# Patient Record
Sex: Male | Born: 1950 | Race: White | Hispanic: No | Marital: Married | Smoking: Former smoker
Health system: Southern US, Community
[De-identification: ages and names within clinical notes are randomized; demographics above are authoritative.]

## PROBLEM LIST (undated history)

## (undated) DIAGNOSIS — F32A Depression, unspecified: Secondary | ICD-10-CM

## (undated) DIAGNOSIS — Z972 Presence of dental prosthetic device (complete) (partial): Secondary | ICD-10-CM

## (undated) DIAGNOSIS — F419 Anxiety disorder, unspecified: Secondary | ICD-10-CM

## (undated) DIAGNOSIS — M1990 Unspecified osteoarthritis, unspecified site: Secondary | ICD-10-CM

## (undated) DIAGNOSIS — I219 Acute myocardial infarction, unspecified: Secondary | ICD-10-CM

## (undated) DIAGNOSIS — G709 Myoneural disorder, unspecified: Secondary | ICD-10-CM

## (undated) DIAGNOSIS — C801 Malignant (primary) neoplasm, unspecified: Secondary | ICD-10-CM

## (undated) DIAGNOSIS — Z973 Presence of spectacles and contact lenses: Secondary | ICD-10-CM

## (undated) DIAGNOSIS — K219 Gastro-esophageal reflux disease without esophagitis: Secondary | ICD-10-CM

## (undated) DIAGNOSIS — I2510 Atherosclerotic heart disease of native coronary artery without angina pectoris: Secondary | ICD-10-CM

## (undated) DIAGNOSIS — G4730 Sleep apnea, unspecified: Secondary | ICD-10-CM

## (undated) DIAGNOSIS — I10 Essential (primary) hypertension: Secondary | ICD-10-CM

## (undated) DIAGNOSIS — F329 Major depressive disorder, single episode, unspecified: Secondary | ICD-10-CM

## (undated) DIAGNOSIS — E119 Type 2 diabetes mellitus without complications: Secondary | ICD-10-CM

## (undated) DIAGNOSIS — N200 Calculus of kidney: Secondary | ICD-10-CM

## (undated) HISTORY — PX: TONSILLECTOMY: SUR1361

## (undated) HISTORY — PX: COLONOSCOPY W/ BIOPSIES: SHX1374

## (undated) HISTORY — PX: MASTOIDECTOMY: SUR855

## (undated) HISTORY — PX: GREAT TOE ARTHRODESIS, INTERPHALANGEAL JOINT: SUR55

## (undated) HISTORY — PX: CHOLECYSTECTOMY: SHX55

## (undated) HISTORY — PX: OTHER SURGICAL HISTORY: SHX169

---

## 2005-03-16 ENCOUNTER — Ambulatory Visit (HOSPITAL_COMMUNITY): Admission: RE | Admit: 2005-03-16 | Discharge: 2005-03-16 | Payer: Self-pay | Admitting: Ophthalmology

## 2005-07-13 ENCOUNTER — Encounter (INDEPENDENT_AMBULATORY_CARE_PROVIDER_SITE_OTHER): Payer: Self-pay | Admitting: Specialist

## 2005-07-13 ENCOUNTER — Ambulatory Visit (HOSPITAL_COMMUNITY): Admission: RE | Admit: 2005-07-13 | Discharge: 2005-07-13 | Payer: Self-pay | Admitting: Ophthalmology

## 2005-09-18 HISTORY — PX: EYE SURGERY: SHX253

## 2007-03-24 ENCOUNTER — Ambulatory Visit: Payer: Self-pay | Admitting: Internal Medicine

## 2007-03-25 ENCOUNTER — Encounter: Payer: Self-pay | Admitting: Internal Medicine

## 2007-03-25 ENCOUNTER — Ambulatory Visit: Payer: Self-pay | Admitting: Internal Medicine

## 2007-03-25 ENCOUNTER — Ambulatory Visit (HOSPITAL_COMMUNITY): Admission: RE | Admit: 2007-03-25 | Discharge: 2007-03-25 | Payer: Self-pay | Admitting: Internal Medicine

## 2007-04-20 ENCOUNTER — Ambulatory Visit: Payer: Self-pay | Admitting: Internal Medicine

## 2009-04-20 HISTORY — PX: CARDIAC CATHETERIZATION: SHX172

## 2010-06-19 HISTORY — PX: EYE SURGERY: SHX253

## 2010-07-17 ENCOUNTER — Ambulatory Visit (HOSPITAL_COMMUNITY): Payer: BC Managed Care – PPO

## 2010-07-17 ENCOUNTER — Observation Stay (HOSPITAL_COMMUNITY)
Admission: RE | Admit: 2010-07-17 | Discharge: 2010-07-18 | Disposition: A | Discharge status: Home / Self Care | Payer: BC Managed Care – PPO | Source: Ambulatory Visit | Attending: Ophthalmology | Admitting: Ophthalmology

## 2010-07-17 DIAGNOSIS — Z01812 Encounter for preprocedural laboratory examination: Secondary | ICD-10-CM | POA: Insufficient documentation

## 2010-07-17 DIAGNOSIS — H35379 Puckering of macula, unspecified eye: Principal | ICD-10-CM | POA: Insufficient documentation

## 2010-07-17 DIAGNOSIS — E119 Type 2 diabetes mellitus without complications: Secondary | ICD-10-CM | POA: Insufficient documentation

## 2010-07-17 DIAGNOSIS — Z9861 Coronary angioplasty status: Secondary | ICD-10-CM | POA: Insufficient documentation

## 2010-07-17 DIAGNOSIS — Z01818 Encounter for other preprocedural examination: Secondary | ICD-10-CM | POA: Insufficient documentation

## 2010-07-17 DIAGNOSIS — I2510 Atherosclerotic heart disease of native coronary artery without angina pectoris: Secondary | ICD-10-CM | POA: Insufficient documentation

## 2010-07-17 LAB — BASIC METABOLIC PANEL
CO2: 29 mEq/L (ref 19–32)
Calcium: 9.7 mg/dL (ref 8.4–10.5)
Chloride: 103 mEq/L (ref 96–112)
Creatinine, Ser: 1.02 mg/dL (ref 0.4–1.5)
GFR calc Af Amer: >60 mL/min (ref >60)
GFR calc non Af Amer: >60 mL/min (ref >60)
Potassium: 4.8 mEq/L (ref 3.5–5.1)
Sodium: 139 mEq/L (ref 135–145)

## 2010-07-17 LAB — CBC
HCT: 42.7 % (ref 39.0–52.0)
Hemoglobin: 13.8 g/dL (ref 13.0–17.0)
MCH: 28.4 pg (ref 26.0–34.0)
MCHC: 32.3 g/dL (ref 30.0–36.0)
MCV: 87.9 fL (ref 78.0–100.0)
Platelets: 289 10*3/uL (ref 150–400)
RBC: 4.86 MIL/uL (ref 4.22–5.81)
RDW: 12.8 % (ref 11.5–15.5)
WBC: 8.2 10*3/uL (ref 4.0–10.5)

## 2010-07-17 LAB — SURGICAL PCR SCREEN
MRSA, PCR: POSITIVE — AB
Staphylococcus aureus: POSITIVE — AB

## 2010-07-17 LAB — GLUCOSE, CAPILLARY
Glucose-Capillary: 134 mg/dL — ABNORMAL HIGH (ref 70–99)
Glucose-Capillary: 154 mg/dL — ABNORMAL HIGH (ref 70–99)

## 2010-07-18 LAB — GLUCOSE, CAPILLARY
Glucose-Capillary: 138 mg/dL — ABNORMAL HIGH (ref 70–99)
Glucose-Capillary: 166 mg/dL — ABNORMAL HIGH (ref 70–99)
Glucose-Capillary: 331 mg/dL — ABNORMAL HIGH (ref 70–99)
Glucose-Capillary: 199 mg/dL — ABNORMAL HIGH (ref 70–99)

## 2010-07-18 NOTE — Op Note (Signed)
  [name], [name]               ACCOUNT NO.:  [id number]  MEDICAL RECORD NO.:  [id number]           PATIENT TYPE:  I  LOCATION:  5125                         FACILITY:  [facility]  PHYSICIAN:  John D. Ashley Royalty, M.D. DATE OF BIRTH:  April 26, 1950  DATE OF PROCEDURE:  07/17/2010 DATE OF DISCHARGE:                              OPERATIVE REPORT   ADMISSION DIAGNOSES:  Preretinal fibrosis, right eye and retinal breaks, right eye.  PROCEDURES:  Pars plana vitrectomy right eye with membrane peel, retinal photocoagulation all in the right eye.  SURGEON:  Beulah Gandy. Ashley Royalty, MD  ASSISTANT:  Rosalie Doctor, MA  ANESTHESIA:  General.  DETAILS:  Usual prep and drape, the indirect retinal ophthalmoscope laser was moved into place, 570 burns were placed around retinal breaks in the lower temporal quadrant and two rows of laser for 360 degrees. The power was 180 milliwatts, 1000 microns each and 0.1 seconds each.  A 25-gauge trocars were placed at 8 and 10 o'clock.  A three layered MVR incision 20-gauge was made at 2 o'clock.  The pars plana vitrectomy was begun just behind the pseudophakos.  Dense vitreous membranes were encountered and they were carefully removed under low suction and rapid cutting.  The macular region came into view and there were white striae in the macula and the membranes were thrown into folds.  The MVR blade was used to engage the internal limiting membrane and the preretinal fibrosis.  These were peeled with the lighted pick with a diamond dusted membrane scraper and forceps until the entire macular region was peeled and free of the preretinal fibrosis membrane.  The vitrectomy was then widened to the mid periphery, where all vitreous was removed and the far periphery where the vitreous base was trimmed for 360 degrees.  Kenalog 0.05 mL was injected into the vitreous cavity.  The trocars were removed from the eye and the wounds were held until tight 25 via  20-gauge opening at 2 o'clock was closed with one 9-0 nylon suture.  The conjunctivae was closed with wet-field cautery.  Polymyxin and gentamicin were irrigated into Tenon space.  Atropine solution was applied.  Marcaine was injected around the globe for postop pain. Closing pressure was 15 with a Barraquer keratometer.  COMPLICATIONS:  None.  OPERATIVE TIME:  1 hour.    Beulah Gandy. Ashley Royalty, M.D.    JDM/MEDQ  D:  07/17/2010  T:  07/18/2010  Job:  [id number]  Electronically Signed by Alan Mulder M.D. on 07/18/2010 06:14:54 AM

## 2010-09-02 NOTE — Assessment & Plan Note (Signed)
NAMEPRAJWAL, FELLNER                CHART#:  [id number]   DATE:  04/20/2007                       DOB:  1950/12/11   CHIEF COMPLAINT:  Followup for diarrhea.   SUBJECTIVE:  Mr. [name] is here for followup.  He recently underwent  ileocolonoscopy with biopsy and stool sampling on March 25, 2007.  He  had presented to our office with a one-month history of incessant,  nonbloody diarrhea of acute onset.  This had occurred after he came off  a two-month course of antibiotics for epididymitis/orchitis.  Endoscopically, he had proximal rectal erosions, extending up into the  mid-sigmoid colon.  The TI was normal.  Stool sampling was negative.  He  had a benign rectal polyp.  Biopsies revealed benign colonic mucosa.  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  Given that he had already been on Flagyl and was quite ill, he  was started on vancomycin and he completed a ten-day course recently.  He states he is much better.  He is having only four stools a day.  His  baseline is usually two to three.  Stools are not entirely formed, but  are semi-formed.  He has no abdominal pain.  He feels well.  He has been  off the antibiotics for at least a week and continues to gradually  improve.  Denies any blood in the stool, abdominal pain, nausea or  vomiting.   CURRENT MEDICATIONS:  See updated list.   ALLERGIES:  No known drug allergies.   PHYSICAL EXAM:  Weight 223.5, stable; temperature 98.1, blood pressure  108/78, pulse 88.  GENERAL:  Pleasant, well-nourished, well-developed, Caucasian male, in  no acute distress.  SKIN:  Warm and dry, no jaundice.  HEENT:  Sclerae anicteric.  ABDOMEN:  Positive bowel sounds, soft, nontender, nondistended.  No  organomegaly or masses.  No rebound tenderness or guarding.  No  abdominal bruits or hernias.  LOWER EXTREMITIES:  No edema.   IMPRESSION:  Mr. [name] is a 60 year old gentleman, suspected to have C.  difficile-associated colitis, which failed to respond to Flagyl.  His  stool studies were negative.  Endoscopically, he did have some distal  erosions from the rectum up to the sigmoid colon.  He has responded  nicely to vancomycin.  He is not quite at baseline, but has markedly  improved.  I discussed the case with Dr. Jena Gauss.  At this time, we will  continue to monitor him off antibiotics.  Patient was informed how to  cleanse his bathroom and make appropriate changes to hopefully avoid re-  infection.   PLAN:  He will go ahead and finish out his Questran.  He may use his  Bentyl as needed for loose stools or diarrhea.  He will continue  Probiotic for at least 30 more days.  He will call if he  develops any more frequent stools or his stools are more loose than they  have been.  Otherwise, we will follow up on a p.r.n. basis only.       Tana Coast, P.A.  Electronically Signed     R. Roetta Sessions, M.D.  Electronically Signed    LL/MEDQ  D:  04/20/2007  T:  04/20/2007  Job:  [id number]   cc:   Dr. Clelia Croft

## 2010-09-02 NOTE — Consult Note (Signed)
NAMEBRYANT, [name]               ACCOUNT NO.:  [id number]   MEDICAL RECORD NO.:  [id number]          PATIENT TYPE:  AMB   LOCATION:  DAY                           FACILITY:  [facility]   PHYSICIAN:  [name], M.D. DATE OF BIRTH:  1950-07-19   DATE OF CONSULTATION:  03/24/2007  DATE OF DISCHARGE:                                 CONSULTATION   REASON FOR CONSULTATION:  Diarrhea for three weeks.   REQUESTING PHYSICIAN:  Dalton Peri, MD, [facility].   HISTORY OF PRESENT ILLNESS:  Patient is a 60 year old Caucasian  gentleman who presents with about a four-week history of diarrhea.  He  states he developed diarrhea around the second week of November.  He had  it four or five days before presenting to see [name] on March 04, 2007.  Symptoms began acutely.  He is having more than 10 stools a day.  He has had no solid stools since that time.  He has had some nocturnal  diarrhea as well.  Denies any blood in the stools.  His stools have been  black.  All stools are very watery.  He complains of abdominal cramping  which is worse when he eats and occurs before his bowel movements.  He  complains of diffuse abdominal soreness as well.  Subsequently, he feels  he has had some fever but this has not been documented.  He has had  nausea but no vomiting.  He has had a flare of reflux.  Denies any  dysphagia or odynophagia.  He has lost about 20 pounds since this  started.  He has been trying a [employer]  He states this has helped  with his nocturnal diarrhea but he continues to have diarrhea throughout  the day.  He took a seven-day course of Flagyl initially.  He did have a  C. difficile which was negative.  Flagyl did not seem to help.  He has  subsequently been on Bentyl and Questran with really no relief.  He  denies any antibiotics before this starts except for about four months  ago.  He was on this for some type of urological problem.  He also  complains of low back  pain for about one week.  Denies any injuries.  Denies any dysuria or hematuria.   MEDICATIONS:  1. Questran 4 g daily although this stopped this the last couple of      days due to abdominal cramps.  2. Bentyl 20 mg q.a.c. and nightly.  3. Doxepin 25 mg b.i.d.  4. Prevacid SoluTab daily.   ALLERGIES:  No known drug allergies.   PAST MEDICAL HISTORY:  1. Diet controlled diabetes mellitus.  2. Gastroesophageal reflux disease.  3. Nephrolithiasis.   PAST SURGICAL HISTORY:  1. He reports a colonoscopy eight years ago by Dr. Rometta Bryant in      [place], [place], which was negative.  2. He has had a tonsillectomy.  3. He had a remote cholecystectomy.  4. Mastoid surgery.  5. Cataract extractions.  6. Surgery due to bleeding in his eye.  7.  He had a cyst removed from his hand.  8. Right  rotator cuff repair.  9. Vasectomy.  10.Kidney stone extraction.   FAMILY HISTORY:  His father has had multiple colonoscopies with removal  of polyps several times.  No family history of IBD or colorectal cancer.   SOCIAL HISTORY:  He is married.  He has two children.  He is employed at  [employer].  He smokes one to one and a half pack of  cigarettes daily.  Denies any alcohol use.   REVIEW OF SYSTEMS:  See HPI for GI and CONSTITUTIONAL.  CARDIOPULMONARY:  Denies chest pain, shortness of breath, dyspnea on exertion.  See HPI  for GU.   PHYSICAL EXAMINATION:  VITAL SIGNS:  Weight 224.5, height 5 feet 8,  temperature 98.8, blood pressure 110/80, pulse 80.  GENERAL APPEARANCE:  Pleasant, well-nourished, well-developed Caucasian  male in no acute distress.  He does not appear acutely ill.  SKIN:  Warm and dry, no jaundice.  HEENT:  Sclerae are nonicteric.  Oropharyngeal mucosa moist and pink.  No lesions, erythema or exudate.  NECK:  No lymphadenopathy or thyromegaly.  CHEST:  Lungs are clear to auscultation.  CARDIOVASCULAR:  Regular rate and rhythm. Normal S1 and S2.  No  murmurs,  rubs, or gallops.  ABDOMEN:  Positive bowel sounds.  Abdomen soft.  He has diffuse  tenderness throughout the abdomen especially coursing along the left  abdomen, across the transverse colon area and back down the right  abdomen.  Really no periumbilical tenderness, no hepatosplenomegaly or  masses, no abdominal bruits or hernias.  EXTREMITIES:  No edema.   IMPRESSION:  [name] is a 60 year old gentleman with a three to four-  week history of acute diarrhea.  He describes having quite a significant  amount of diarrhea including nocturnal diarrhea.  No blood in the  stools.  He has had a massive weight loss of 20 pounds in the last three  to four weeks.  Etiology of diarrhea unclear at this time.  He has had  one negative Clostridium difficile.  Otherwise, really no work-up has  been done.  This could be anything from infectious to inflammatory bowel  disease, microscopic colitis or even metabolic abnormality such as  hypothyroidism.   PLAN:  1. CMET, CBC, TSH stat.  2. Colonoscopy tomorrow.  3. Patient was advised to discontinue Questran until after his      procedure.   I have discussed the above case with Dr. Jena Bryant, who is in agreement with  the plan.  I would like to thank [name] for allowing [place] to take part  in the care of this patient.      [name], P.AJonathon Bryant, M.D.  Electronically Signed    LL/MEDQ  D:  03/24/2007  T:  03/24/2007  Job:  [id number]

## 2010-09-02 NOTE — Op Note (Signed)
NAMESTEFANO, [name]               ACCOUNT NO.:  [id number]   MEDICAL RECORD NO.:  [id number]          PATIENT TYPE:  AMB   LOCATION:  DAY                           FACILITY:  [facility]   PHYSICIAN:  R. Roetta Sessions, M.D. DATE OF BIRTH:  04/15/1951   DATE OF PROCEDURE:  03/25/2007  DATE OF DISCHARGE:                                PROCEDURE NOTE   PROCEDURE:  Ileocolonoscopy with biopsy and stool sampling.   ENDOSCOPIST:  Jonathon Bellows, M.D.   INDICATIONS FOR PROCEDURE:  This is a 60 year old gentleman with a 1-  month history of incessant non-bloody diarrhea of acute onset.  The  patient had just come off a 30-month course of antibiotics for epididymo-  orchitis.  He had normal bowel function describes as 1-2 formed bowel  movements daily prior to the acute onset of diarrhea 1 month ago.  A  Clostridium difficile toxin assay came back negative.  He failed to  respond to a 7-day course of Flagyl.  There is no history of  inflammatory bowel disease.  No one else around him has diarrhea.  He is  not using nonsteroidal agents.  Ileocolonoscopy is now being done to  further evaluate his symptoms.  This approach has discussed with the  patient at length and potential risks, benefits and alternatives have  been reviewed and questions answered; she is agreeable.  Please see  documentation in the medical record.   PROCEDURE NOTE:  O2 saturation, blood pressure, pulse and respirations  were monitored throughout the entire procedure.   CONSCIOUS SEDATION:  Demerol 75 mg IV, Versed 4 mg IV in divided doses.   INSTRUMENT:  Pentax video chip system.   FINDINGS:  Digital rectal exam revealed no abnormalities.   ENDOSCOPIC FINDINGS:  The prep was good.   COLON:  The colonic mucosa was surveyed from the rectosigmoid junction  through the left, transverse and right colon to the area of the  appendiceal orifice, ileocecal valve and cecum.  These structures were  well seen and photographed for  the record.  Terminal ileum was intubated  to 10 cm; from this level, the scope was slowly withdrawn.  All  previously mentioned mucosal surfaces were again seen.  The patient had  numerous discrete, approximately 8 x 8-mm areas of erosions in his  proximal rectum extending up into the mid sigmoid.  Proximal to the mid  sigmoid, the colonic mucosa appeared entirely normal, as did the  terminal ileal mucosa.  No other abnormalities were seen.  The endoscope  was pulled down into the rectum and again in the proximal rectum, there  were numerous discrete erosions.  There were no pseudomembranes.  A  thorough examination of the rectal mucosa demonstrated a normal-  appearing distal rectum aside from a 5-mm polyp at 10 cm and which was  cold-biopsied/removed.  Segmental biopsies of the descending and sigmoid  were taken.  The abnormal areas of eroded rectosigmoid mucosa were  biopsied separately.  A retroflexed view of the distal rectum  demonstrated no other abnormalities.  A fresh stool specimen has been  sent STAT  to the lab for C. difficile toxin assay.  The patient  tolerated the procedure well and was reactive after endoscopy.   IMPRESSION:  1. Diminutive polyp at 10 cm (rectum), status post cold      biopsy/removal.  2. Proximal rectal erosions extending up into the mid sigmoid;      otherwise, more proximal colon appeared normal, as did terminal      ileal mucosa, segmental biopsy and stool sample being obtained.   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   RECOMMENDATIONS:  We will follow up on STAT Clostridium difficile toxin  assay and biopsies as soon as they are available.   1. I anticipate this [name] may well go on a course of oral      vancomycin in the very near future.  2. Further recommendations to follow.  3. Follow up on  pathology.      Jonathon Bellows, M.D.  Electronically Signed     RMR/MEDQ  D:  03/25/2007  T:  03/25/2007  Job:  [id number]   cc:   Kirstie Peri, MD  Fax: [phone]

## 2010-09-05 NOTE — Op Note (Signed)
NAMEGAMAL, TODISCO               ACCOUNT NO.:  [id number]   MEDICAL RECORD NO.:  [id number]          PATIENT TYPE:  AMB   LOCATION:  SDS                          FACILITY:  [facility]   PHYSICIAN:  Lanna Poche, M.D. DATE OF BIRTH:  06-Mar-1951   DATE OF PROCEDURE:  07/13/2005  DATE OF DISCHARGE:  07/13/2005                                 OPERATIVE REPORT   PREOPERATIVE DIAGNOSIS:  Conjunctival goblet cyst, left eye.   POSTOPERATIVE DIAGNOSIS:  Conjunctival goblet cyst, left eye.   OPERATION PERFORMED:  Excision of goblet cyst, reapposition of conjunctival  edges, left eye.   SURGEON:  Lanna Poche, M.D.   ASSISTANT:  Ulysees Barns   ANESTHESIA:  Local with monitored anesthesia care.   ESTIMATED BLOOD LOSS:  Less than 1mL.   COMPLICATIONS:  None.   SPECIMENS:  Excised goblet cell cyst.   DESCRIPTION OF PROCEDURE:  The patient was taken to the operating room where  local IV sedation a retrobulbar mixture of 5 mL volume of 4% lidocaine and  0.75% Marcaine was given around the left eye with adequate akinesia and  anesthesia.  The eye was prepped and draped in usual fashion.  The lid  speculum was introduced.  Inspection showed there to be a large elevated  cystic area superonasally.  The conjunctiva was then incised down to the  wall of the cyst.  A glistening cyst could be seen with a mucilaginous  material inside.  Sharp dissection excised the conjunctiva off the cyst.  Hemostasis was obtained where small bleeders were encountered.  The cyst was  then undermined on the underside up against the sclera until the cyst was  freed up and mobile when the cyst was seen to rupture.  The turbid fluid was  then irrigated copiously off the surface of the eye.  The wall of the cyst  was then grasped and the cyst sharply dissected off the remaining tissue.  The cyst was sent for pathology.  Additional small areas of __________  on  the surface of the Tenon's and conjunctiva area  was sharply incised, giving  what was felt to be clear margins.  Hemostasis was obtained with eraser  cautery.  No further bleeding or cystic sac material was seen and the  conjunctiva was then reapproximated with interrupted and running sutures of  6-0 plain gut.  The subconjunctival space was injected with 100 mg of  ceftazidime and 10 mg of Decadron.  The  pressure was checked and found to be lying at 21.  The lid speculum was then  removed and mixed antibiotic ointment was applied to the surface of the eye.  An eye patch and shield were then placed on the patient's left eye.  The  patient left the operating room in stable condition.           ______________________________  Lanna Poche, M.D.     JTH/MEDQ  D:  07/13/2005  T:  07/15/2005  Job:  [id number]

## 2010-09-05 NOTE — Op Note (Signed)
NAMEBURK, HOCTOR               ACCOUNT NO.:  [id number]   MEDICAL RECORD NO.:  [id number]          PATIENT TYPE:  AMB   LOCATION:  SDS                          FACILITY:  [facility]   PHYSICIAN:  Lanna Poche, M.D. 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   PROCEDURE:  Partial vitrectomy and membrane peeling, additional laser for  retinal break, air fluid exchange, and subtenon's 40 mg Kenalog, left eye.   SURGEON:  Lanna Poche, M.D.   ASSISTANT:  Ulysees Barns.   ANESTHESIA:  General endotracheal.   ESTIMATED BLOOD LOSS:  Less than 1 mL.   COMPLICATIONS:  None.   OPERATIVE NOTE:  The patient was taken to the operating room where after  induction of general anesthesia the left eye was prepped and draped in the  usual fashion.  The lid speculum was introduced, and a conjunctival peritomy  was developed temporally and superonasally.  Hemostasis was obtained with  electrocautery, and sclerotomies were fashioned 3 mm posterior to the limbus  at 1:30, 10:30, and 4:30.  The superior sclerotomies were plugged and a 4 mm  infusion cannula was secured at the 4:30 sclerotomy and temporarily sutured  with 7-0 Vicryl.  The tip of the infusion was inspected and found to be in  good position.  A Lander's ring was carried with 7-0 Vicryl sutures at 3 and  9.  The plugs were removed with a 32-degree prismatic lens applied to the  surface of the eye.  A central core followed by a peripheral vitrectomy was  performed.  There was posterior hyaloid separation.  The pneumatic high  opaque lens was then applied to the surface of the eye.  Inspection of the  macula in the subconjunctival  capsule confirmed the absence of the cortical  vitreous.  Dense epiretinal membranes/preretinal fibrosis seen over the  macula with distortion on the fovea in the area.  An edge in the membrane  was found with the rice pick and elevated off the superior part of the  macula.  The 135-microscope was then used to further trim the very large  membrane temporally.  It was very thick and gelatinous temporally and it was  more organized nasally, and so the nasal area was grasped with the [facility]  forceps and peeled off the macula area, and then further peeling was  performed temporally.  The membrane was removed en bloc.  Inspection of the  macular area showed there to be a slit-like area where the fovea had been,  but no full-thickness retinal break or tear was appreciated in the fovea.  The instruments were removed from the eye, and the hole was plugged.  Inspection under ophthalmoscope and scleral pressure and after the Lander's  ring was removed, revealed there to be no additional peripheral retinal  breaks  or tears, and the indirect laser was then used to perform additional  supplemental photocoagulation on the anterior aspect of the large retinal  tear.  A gas exchange was then performed with air using the silicon-tipped  catheter and indirect ophthalmoscope.  The superior sclerotomies were then  closed with 7-0 Vicryl.  The infusion cannula was removed and the pre-placed  sutures secured.  A piece of suture was found to be frayed, and this was  oversewn with an additional suture of 7-0 Vicryl.  The subtenon's space was  then injected with 40 mg of Kenalog infratemporally.  The conjunctiva was  then drawn up and reapproximated with interrupted running sutures of 6-0  plain gut.  The pressure was checked and found to lay at approximately 15.  The subconjunctival space was irrigated with 0.75% Marcaine followed by a  subconjunctival injection of 100 mg Ceftazidime and 10 mg of Decadron.  The  lid  speculum was then removed, and a mixed antibiotic ointment applied to  the surface of the eye.  A patch and a shield were then placed over the  patient's left eye.  Following awakening from anesthesia, the patient left  the operating room in stable condition.           ______________________________  Lanna Poche, M.D.     JTH/MEDQ  D:  03/16/2005  T:  03/16/2005  Job:  [id number]

## 2011-01-26 LAB — FECAL LACTOFERRIN, QUANT: Fecal Lactoferrin: NEGATIVE

## 2011-01-26 LAB — STOOL CULTURE

## 2011-01-26 LAB — CLOSTRIDIUM DIFFICILE EIA: C difficile Toxins A+B, EIA: NEGATIVE

## 2011-01-26 LAB — OVA AND PARASITE EXAMINATION: Ova and parasites: NONE SEEN

## 2011-10-27 ENCOUNTER — Other Ambulatory Visit: Payer: Self-pay | Admitting: Dermatology

## 2012-03-21 ENCOUNTER — Encounter: Payer: Self-pay | Admitting: Internal Medicine

## 2012-04-18 ENCOUNTER — Other Ambulatory Visit: Payer: Self-pay | Admitting: Orthopedic Surgery

## 2012-04-18 ENCOUNTER — Encounter (HOSPITAL_BASED_OUTPATIENT_CLINIC_OR_DEPARTMENT_OTHER): Payer: Self-pay | Admitting: *Deleted

## 2012-04-18 NOTE — Progress Notes (Signed)
Called for cardiology notes and ekg Will need istat-lives in [place]

## 2012-04-19 ENCOUNTER — Encounter (HOSPITAL_BASED_OUTPATIENT_CLINIC_OR_DEPARTMENT_OTHER)
Admission: RE | Disposition: A | Discharge status: Home / Self Care | Payer: Self-pay | Source: Ambulatory Visit | Attending: Orthopedic Surgery

## 2012-04-19 ENCOUNTER — Encounter (HOSPITAL_BASED_OUTPATIENT_CLINIC_OR_DEPARTMENT_OTHER): Payer: Self-pay | Admitting: Orthopedic Surgery

## 2012-04-19 ENCOUNTER — Encounter (HOSPITAL_BASED_OUTPATIENT_CLINIC_OR_DEPARTMENT_OTHER): Payer: Self-pay | Admitting: Anesthesiology

## 2012-04-19 ENCOUNTER — Ambulatory Visit (HOSPITAL_BASED_OUTPATIENT_CLINIC_OR_DEPARTMENT_OTHER)
Admission: RE | Admit: 2012-04-19 | Discharge: 2012-04-19 | Disposition: A | Discharge status: Home / Self Care | Payer: BC Managed Care – PPO | Source: Ambulatory Visit | Attending: Orthopedic Surgery | Admitting: Orthopedic Surgery

## 2012-04-19 ENCOUNTER — Ambulatory Visit (HOSPITAL_BASED_OUTPATIENT_CLINIC_OR_DEPARTMENT_OTHER): Payer: BC Managed Care – PPO | Admitting: Anesthesiology

## 2012-04-19 ENCOUNTER — Encounter (HOSPITAL_BASED_OUTPATIENT_CLINIC_OR_DEPARTMENT_OTHER): Payer: Self-pay

## 2012-04-19 DIAGNOSIS — G4730 Sleep apnea, unspecified: Secondary | ICD-10-CM | POA: Insufficient documentation

## 2012-04-19 DIAGNOSIS — Z87891 Personal history of nicotine dependence: Secondary | ICD-10-CM | POA: Insufficient documentation

## 2012-04-19 DIAGNOSIS — K219 Gastro-esophageal reflux disease without esophagitis: Secondary | ICD-10-CM | POA: Insufficient documentation

## 2012-04-19 DIAGNOSIS — Z9089 Acquired absence of other organs: Secondary | ICD-10-CM | POA: Insufficient documentation

## 2012-04-19 DIAGNOSIS — M6530 Trigger finger, unspecified finger: Secondary | ICD-10-CM | POA: Insufficient documentation

## 2012-04-19 DIAGNOSIS — I2510 Atherosclerotic heart disease of native coronary artery without angina pectoris: Secondary | ICD-10-CM | POA: Insufficient documentation

## 2012-04-19 DIAGNOSIS — M129 Arthropathy, unspecified: Secondary | ICD-10-CM | POA: Insufficient documentation

## 2012-04-19 DIAGNOSIS — I10 Essential (primary) hypertension: Secondary | ICD-10-CM | POA: Insufficient documentation

## 2012-04-19 DIAGNOSIS — G5600 Carpal tunnel syndrome, unspecified upper limb: Secondary | ICD-10-CM | POA: Insufficient documentation

## 2012-04-19 DIAGNOSIS — I252 Old myocardial infarction: Secondary | ICD-10-CM | POA: Insufficient documentation

## 2012-04-19 HISTORY — DX: Myoneural disorder, unspecified: G70.9

## 2012-04-19 HISTORY — DX: Atherosclerotic heart disease of native coronary artery without angina pectoris: I25.10

## 2012-04-19 HISTORY — DX: Gastro-esophageal reflux disease without esophagitis: K21.9

## 2012-04-19 HISTORY — PX: TRIGGER FINGER RELEASE: SHX641

## 2012-04-19 HISTORY — DX: Acute myocardial infarction, unspecified: I21.9

## 2012-04-19 HISTORY — DX: Sleep apnea, unspecified: G47.30

## 2012-04-19 HISTORY — PX: CARPAL TUNNEL RELEASE: SHX101

## 2012-04-19 HISTORY — DX: Essential (primary) hypertension: I10

## 2012-04-19 HISTORY — DX: Unspecified osteoarthritis, unspecified site: M19.90

## 2012-04-19 HISTORY — DX: Presence of dental prosthetic device (complete) (partial): Z97.2

## 2012-04-19 HISTORY — DX: Presence of spectacles and contact lenses: Z97.3

## 2012-04-19 LAB — POCT I-STAT, CHEM 8
BUN: 13 mg/dL (ref 6–23)
Chloride: 106 mEq/L (ref 96–112)
Creatinine, Ser: 0.9 mg/dL (ref 0.50–1.35)
Glucose, Bld: 126 mg/dL — ABNORMAL HIGH (ref 70–99)
HCT: 47 % (ref 39.0–52.0)
Potassium: 4.5 mEq/L (ref 3.5–5.1)

## 2012-04-19 LAB — GLUCOSE, CAPILLARY: Glucose-Capillary: 118 mg/dL — ABNORMAL HIGH (ref 70–99)

## 2012-04-19 SURGERY — CARPAL TUNNEL RELEASE
Anesthesia: General | Site: Hand | Laterality: Left | Wound class: Clean

## 2012-04-19 MED ORDER — LACTATED RINGERS IV SOLN
INTRAVENOUS | Status: DC
  Administered 2012-04-19 (×2): via INTRAVENOUS

## 2012-04-19 MED ORDER — EPHEDRINE SULFATE 50 MG/ML IJ SOLN
INTRAMUSCULAR | Status: DC | PRN
  Administered 2012-04-19: 10 mg via INTRAVENOUS
  Administered 2012-04-19: 5 mg via INTRAVENOUS

## 2012-04-19 MED ORDER — PROMETHAZINE HCL 25 MG/ML IJ SOLN: 6.2500 mg | INTRAMUSCULAR | Status: DC | PRN

## 2012-04-19 MED ORDER — 0.9 % SODIUM CHLORIDE (POUR BTL) OPTIME
TOPICAL | Status: DC | PRN
  Administered 2012-04-19: 200 mL

## 2012-04-19 MED ORDER — FENTANYL CITRATE 0.05 MG/ML IJ SOLN
INTRAMUSCULAR | Status: DC | PRN
  Administered 2012-04-19 (×2): 50 ug via INTRAVENOUS

## 2012-04-19 MED ORDER — BUPIVACAINE HCL (PF) 0.25 % IJ SOLN
INTRAMUSCULAR | Status: DC | PRN
  Administered 2012-04-19: 10 mL

## 2012-04-19 MED ORDER — HYDROCODONE-ACETAMINOPHEN 5-325 MG PO TABS: 1.0000 | ORAL_TABLET | Freq: Four times a day (QID) | ORAL | Status: DC | PRN

## 2012-04-19 MED ORDER — OXYCODONE HCL 5 MG PO TABS
5.0000 mg | ORAL_TABLET | Freq: Once | ORAL | Status: AC | PRN
  Administered 2012-04-19: 5 mg via ORAL

## 2012-04-19 MED ORDER — ONDANSETRON HCL 4 MG/2ML IJ SOLN
INTRAMUSCULAR | Status: DC | PRN
  Administered 2012-04-19: 4 mg via INTRAVENOUS

## 2012-04-19 MED ORDER — CHLORHEXIDINE GLUCONATE 4 % EX LIQD: 60.0000 mL | Freq: Once | CUTANEOUS | Status: DC

## 2012-04-19 MED ORDER — LIDOCAINE HCL (CARDIAC) 20 MG/ML IV SOLN
INTRAVENOUS | Status: DC | PRN
  Administered 2012-04-19: 100 mg via INTRAVENOUS

## 2012-04-19 MED ORDER — OXYCODONE HCL 5 MG/5ML PO SOLN: 5.0000 mg | Freq: Once | ORAL | Status: AC | PRN

## 2012-04-19 MED ORDER — FENTANYL CITRATE 0.05 MG/ML IJ SOLN: 50.0000 ug | Freq: Once | INTRAMUSCULAR | Status: DC

## 2012-04-19 MED ORDER — DEXAMETHASONE SODIUM PHOSPHATE 10 MG/ML IJ SOLN
INTRAMUSCULAR | Status: DC | PRN
  Administered 2012-04-19: 10 mg via INTRAVENOUS

## 2012-04-19 MED ORDER — HYDROMORPHONE HCL PF 1 MG/ML IJ SOLN
0.2500 mg | INTRAMUSCULAR | Status: DC | PRN
  Administered 2012-04-19: 0.5 mg via INTRAVENOUS
  Administered 2012-04-19: 0.25 mg via INTRAVENOUS
  Administered 2012-04-19: 0.5 mg via INTRAVENOUS

## 2012-04-19 MED ORDER — PROPOFOL 10 MG/ML IV BOLUS
INTRAVENOUS | Status: DC | PRN
  Administered 2012-04-19: 200 mg via INTRAVENOUS

## 2012-04-19 MED ORDER — CEFAZOLIN SODIUM-DEXTROSE 2-3 GM-% IV SOLR
2.0000 g | INTRAVENOUS | Status: AC
  Administered 2012-04-19: 2 g via INTRAVENOUS

## 2012-04-19 MED ORDER — MIDAZOLAM HCL 2 MG/2ML IJ SOLN
1.0000 mg | INTRAMUSCULAR | Status: DC | PRN
  Administered 2012-04-19 (×2): 1 mg via INTRAVENOUS

## 2012-04-19 MED ORDER — MIDAZOLAM HCL 5 MG/5ML IJ SOLN
INTRAMUSCULAR | Status: DC | PRN
  Administered 2012-04-19: 2 mg via INTRAVENOUS

## 2012-04-19 SURGICAL SUPPLY — 46 items
BANDAGE COBAN STERILE 2 (GAUZE/BANDAGES/DRESSINGS) ×3 IMPLANT
BANDAGE GAUZE ELAST BULKY 4 IN (GAUZE/BANDAGES/DRESSINGS) ×3 IMPLANT
BLADE SURG 15 STRL LF DISP TIS (BLADE) ×2 IMPLANT
BLADE SURG 15 STRL SS (BLADE) ×1
BNDG COHESIVE 3X5 TAN STRL LF (GAUZE/BANDAGES/DRESSINGS) ×3 IMPLANT
BNDG ESMARK 4X9 LF (GAUZE/BANDAGES/DRESSINGS) ×3 IMPLANT
CHLORAPREP W/TINT 26ML (MISCELLANEOUS) ×3 IMPLANT
CLOTH BEACON ORANGE TIMEOUT ST (SAFETY) ×3 IMPLANT
CORDS BIPOLAR (ELECTRODE) ×3 IMPLANT
COVER MAYO STAND STRL (DRAPES) ×3 IMPLANT
COVER TABLE BACK 60X90 (DRAPES) ×3 IMPLANT
CUFF TOURNIQUET SINGLE 18IN (TOURNIQUET CUFF) ×3 IMPLANT
DECANTER SPIKE VIAL GLASS SM (MISCELLANEOUS) IMPLANT
DRAPE EXTREMITY T 121X128X90 (DRAPE) ×3 IMPLANT
DRAPE SURG 17X23 STRL (DRAPES) ×3 IMPLANT
DRSG KUZMA FLUFF (GAUZE/BANDAGES/DRESSINGS) ×3 IMPLANT
GAUZE XEROFORM 1X8 LF (GAUZE/BANDAGES/DRESSINGS) ×3 IMPLANT
GLOVE BIO SURGEON STRL SZ 6.5 (GLOVE) ×6 IMPLANT
GLOVE BIO SURGEON STRL SZ7 (GLOVE) IMPLANT
GLOVE BIO SURGEON STRL SZ7.5 (GLOVE) ×3 IMPLANT
GLOVE BIOGEL PI IND STRL 7.0 (GLOVE) ×2 IMPLANT
GLOVE BIOGEL PI IND STRL 8 (GLOVE) ×2 IMPLANT
GLOVE BIOGEL PI IND STRL 8.5 (GLOVE) ×2 IMPLANT
GLOVE BIOGEL PI INDICATOR 7.0 (GLOVE) ×1
GLOVE BIOGEL PI INDICATOR 8 (GLOVE) ×1
GLOVE BIOGEL PI INDICATOR 8.5 (GLOVE) ×1
GLOVE INDICATOR 8.5 STRL (GLOVE) IMPLANT
GLOVE SURG ORTHO 8.0 STRL STRW (GLOVE) ×3 IMPLANT
GOWN BRE IMP PREV XXLGXLNG (GOWN DISPOSABLE) ×6 IMPLANT
GOWN PREVENTION PLUS XLARGE (GOWN DISPOSABLE) ×3 IMPLANT
NEEDLE 27GAX1X1/2 (NEEDLE) ×3 IMPLANT
NS IRRIG 1000ML POUR BTL (IV SOLUTION) ×3 IMPLANT
PACK BASIN DAY SURGERY FS (CUSTOM PROCEDURE TRAY) ×3 IMPLANT
PAD CAST 3X4 CTTN HI CHSV (CAST SUPPLIES) ×2 IMPLANT
PADDING CAST ABS 4INX4YD NS (CAST SUPPLIES) ×1
PADDING CAST ABS COTTON 4X4 ST (CAST SUPPLIES) ×2 IMPLANT
PADDING CAST COTTON 3X4 STRL (CAST SUPPLIES) ×1
SPONGE GAUZE 4X4 12PLY (GAUZE/BANDAGES/DRESSINGS) ×3 IMPLANT
STOCKINETTE 4X48 STRL (DRAPES) ×3 IMPLANT
SUT VICRYL 4-0 PS2 18IN ABS (SUTURE) IMPLANT
SUT VICRYL RAPIDE 4/0 PS 2 (SUTURE) ×3 IMPLANT
SYR BULB 3OZ (MISCELLANEOUS) ×3 IMPLANT
SYR CONTROL 10ML LL (SYRINGE) ×3 IMPLANT
TOWEL OR 17X24 6PK STRL BLUE (TOWEL DISPOSABLE) ×3 IMPLANT
UNDERPAD 30X30 INCONTINENT (UNDERPADS AND DIAPERS) ×3 IMPLANT
WATER STERILE IRR 1000ML POUR (IV SOLUTION) IMPLANT

## 2012-04-19 NOTE — H&P (Signed)
[name] is a 61 year old right hand dominant male who comes in complaining of catching of his left ring finger. This has been going on since August. He has had 1 injection by his primary care physician which gave him no relief. He has no prior history of injury. He complains of constant moderate to extremely severe throbbing and aching pain with a feeling of weakness. It occasionally awakens him at night with numbness. He states it is getting worse. Heat and elevation have helped. He is diabetic. He has a history of arthritis. He has no history of thyroid problems or gout. Family history is positive for diabetes, thyroid problems, arthritis and gout. He is on Plavix.  He has had his nerve conductions done and these reveal bilateral carpal tunnel syndrome with a motor delay of 4.3 on the left, 4.0 on the right, sensory delay of 2.8 on the left and 2.5 on the right with a amplitude diminution to 18 on the left and 20 on the right.  He has triggering of his left ring finger. This has been injected on one occasion without any relief.  He is offered a second injection to the stenosing tenosynovitis.  He has declined. He would like to have this surgically taken care of.  We would recommend doing both the carpal tunnel release and the trigger finger release of his left ring finger in the left hand.     PAST MEDICAL HISTORY: He has no known drug allergies. He is currently taking no medications. He has had heart stens, cholecystectomy, eye surgery, toe surgery and mastoid surgery. He had a shoulder surgery by Dr. Teressa Senter.  MEDICATIONS: See List in chart.  FAMILY H ISTORY: Diabetes, heart disease, high BP and arthritis.  SOCIAL HISTORY: He does not smoke or drink. He is married. He is retired.  REVIEW OF SYSTEMS: Positive for glasses, hearing loss, heart attack, pneumonia, headaches, sleep disorder, easy bleeding and easy bruising.  [name] is an 61 y.o. male.   Chief Complaint: CTS lt sts lrf HPI:  see above  Past Medical History  Diagnosis Date  . Coronary artery disease   . Myocardial infarction   . Hypertension   . Sleep apnea     uses a cpap  . Arthritis   . Wears glasses   . Wears partial dentures     lower partial  . GERD (gastroesophageal reflux disease)   . Neuromuscular disorder     carpal tunnel synd    Past Surgical History  Procedure Date  . Cholecystectomy   . Cardiac catheterization 2011    stent  . Mastoidectomy   . Eye surgery 3/12    rt eye-retinax2  . Eye surgery 06,07    lt eye cyst  . Colonoscopy w/ biopsies   . Tonsillectomy   . Great toe arthrodesis, interphalangeal joint     lt    History reviewed. No pertinent family history. Social History:  reports that he quit smoking about 4 years ago. He does not have any smokeless tobacco history on file. He reports that he does not drink alcohol or use illicit drugs.  Allergies: No Known Allergies  Medications Prior to Admission  Medication Sig Dispense Refill  . carvedilol (COREG) 3.125 MG tablet Take 3.125 mg by mouth 2 (two) times daily with a meal.      . clopidogrel (PLAVIX) 75 MG tablet Take 75 mg by mouth daily.      [name] doxepin (SINEQUAN) 25 MG capsule Take  25 mg by mouth at bedtime.      [name] doxycycline (PERIOSTAT) 20 MG tablet Take 20 mg by mouth 2 (two) times daily.      [name] gabapentin (NEURONTIN) 100 MG capsule Take 100 mg by mouth daily.      [name] glyBURIDE-metformin (GLUCOVANCE) 2.5-500 MG per tablet Take 1 tablet by mouth 2 (two) times daily with a meal. Takes 2 at a time      . insulin glargine (LANTUS) 100 UNIT/ML injection Inject 8 Units into the skin at bedtime.      [name] linagliptin (TRADJENTA) 5 MG TABS tablet Take 5 mg by mouth daily.      [name] lisinopril (PRINIVIL,ZESTRIL) 5 MG tablet Take 5 mg by mouth 2 (two) times daily.      [name] omeprazole (PRILOSEC) 20 MG capsule Take 20 mg by mouth daily.      . simvastatin (ZOCOR) 40 MG tablet Take 40 mg by mouth every evening.        Results for  orders placed during the hospital encounter of 04/19/12 (from the past 48 hour(s))  POCT I-STAT, CHEM 8     Status: Abnormal   Collection Time   04/19/12 10:02 AM      Component Value Range Comment   Sodium 139  135 - 145 mEq/L    Potassium 4.5  3.5 - 5.1 mEq/L    Chloride 106  96 - 112 mEq/L    BUN 13  6 - 23 mg/dL    Creatinine, Ser 1.61  0.50 - 1.35 mg/dL    Glucose, Bld 096 (*) 70 - 99 mg/dL    Calcium, Ion 0.45  4.09 - 1.30 mmol/L    TCO2 24  0 - 100 mmol/L    Hemoglobin 16.0  13.0 - 17.0 g/dL    HCT 81.1  91.4 - 78.2 %     No results found.   Pertinent items are noted in HPI.  Blood pressure 119/80, pulse 82, temperature 97.6 F (36.4 C), temperature source Oral, resp. rate 20, height 5\' 8"  (1.727 m), weight 112.583 kg (248 lb 3.2 oz), SpO2 97.00%.  General appearance: cooperative and appears stated age Head: Normocephalic, without obvious abnormality Neck: no adenopathy Resp: clear to auscultation bilaterally Cardio: regular rate and rhythm, S1, S2 normal, no murmur, click, rub or gallop GI: soft, non-tender; bowel sounds normal; no masses,  no organomegaly Extremities: extremities normal, atraumatic, no cyanosis or edema Pulses: 2+ and symmetric Skin: intact Neurologic: Grossly normal Incision/Wound: na  Assessment/Plan The pre, peri and postoperative course were discussed along with the risks and complications.  The patient is aware there is no guarantee with the surgery, possibility of infection, recurrence, injury to arteries, nerves, tendons, incomplete relief of symptoms and dystrophy.  He is scheduled for left carpal tunnel release, release A-1 pulley left ring finger as an outpatient.   Ura Hausen R 04/19/2012, 10:25 AM

## 2012-04-19 NOTE — Transfer of Care (Signed)
Immediate Anesthesia Transfer of Care Note  Patient: [name]  Procedure(s) Performed: Procedure(s) (LRB) with comments: CARPAL TUNNEL RELEASE (Left) - LT CTR Anesthesia: IV Regional FAB RELEASE TRIGGER FINGER/A-1 PULLEY (Left) - Release  A-1 Pulley Left Ring Finger   Patient Location: PACU  Anesthesia Type:General  Level of Consciousness: awake and patient cooperative  Airway & Oxygen Therapy: Patient Spontanous Breathing and Patient connected to face mask oxygen  Post-op Assessment: Report given to PACU RN and Post -op Vital signs reviewed and stable  Post vital signs: Reviewed and stable  Complications: No apparent anesthesia complications

## 2012-04-19 NOTE — Anesthesia Procedure Notes (Signed)
Procedure Name: LMA Insertion Date/Time: 04/19/2012 11:33 AM Performed by: Jearldean Gutt D Pre-anesthesia Checklist: Patient identified, Emergency Drugs available, Suction available and Patient being monitored Patient Re-evaluated:Patient Re-evaluated prior to inductionOxygen Delivery Method: Circle System Utilized Preoxygenation: Pre-oxygenation with 100% oxygen Intubation Type: IV induction Ventilation: Mask ventilation without difficulty LMA: LMA inserted LMA Size: 5.0 Number of attempts: 1 Airway Equipment and Method: bite block Placement Confirmation: positive ETCO2 and breath sounds checked- equal and bilateral Tube secured with: Tape Dental Injury: Teeth and Oropharynx as per pre-operative assessment

## 2012-04-19 NOTE — Progress Notes (Signed)
Pt mentioned to Dr Gypsy Balsam that he was "nervous" Dr Gypsy Balsam order Versed IV  Pt Placed on monitor and given versed 1mg  IVP

## 2012-04-19 NOTE — Op Note (Signed)
Dictation Number [id number]

## 2012-04-19 NOTE — Anesthesia Preprocedure Evaluation (Addendum)
Anesthesia Evaluation  Patient identified by MRN, date of birth, ID band Patient awake    Reviewed: Allergy & Precautions, H&P , NPO status , Patient's Chart, lab work & pertinent test results  Airway Mallampati: II TM Distance: >3 FB Neck ROM: Full    Dental   Pulmonary sleep apnea , former smoker,  breath sounds clear to auscultation        Cardiovascular hypertension, + CAD and + Past MI Rhythm:Regular Rate:Normal     Neuro/Psych  Neuromuscular disease    GI/Hepatic GERD-  ,  Endo/Other  diabetes  Renal/GU      Musculoskeletal   Abdominal   Peds  Hematology   Anesthesia Other Findings   Reproductive/Obstetrics                          Anesthesia Physical Anesthesia Plan  ASA: III  Anesthesia Plan: General   Post-op Pain Management:    Induction: Intravenous  Airway Management Planned: LMA  Additional Equipment:   Intra-op Plan:   Post-operative Plan: Extubation in OR  Informed Consent: I have reviewed the patients History and Physical, chart, labs and discussed the procedure including the risks, benefits and alternatives for the proposed anesthesia with the patient or authorized representative who has indicated his/her understanding and acceptance.     Plan Discussed with: CRNA and Surgeon  Anesthesia Plan Comments:         Anesthesia Quick Evaluation

## 2012-04-19 NOTE — Progress Notes (Signed)
Pt still waiting , second mg of versed given Pt resting quietly

## 2012-04-19 NOTE — Brief Op Note (Signed)
04/19/2012  12:01 PM  PATIENT:  Jonell R Saxton  61 y.o. male  PRE-OPERATIVE DIAGNOSIS:  Left Carpal Tunnel Syndrome and Left Ring Trigger Finger  POST-OPERATIVE DIAGNOSIS:  Left Carpal Tunnel Syndrome and Left Ring Trigger Finger  PROCEDURE:  Procedure(s) (LRB) with comments: CARPAL TUNNEL RELEASE (Left) - LT CTR Anesthesia: IV Regional FAB RELEASE TRIGGER FINGER/A-1 PULLEY (Left) - Release  A-1 Pulley Left Ring Finger   SURGEON:  Surgeon(s) and Role:    * Nicki Reaper, MD - Primary    * Tami Ribas, MD - Assisting  PHYSICIAN ASSISTANT:   ASSISTANTS: K Aleatha Taite,MD   ANESTHESIA:   local and general  EBL:  Total I/O In: 1000 [I.V.:1000] Out: -   BLOOD ADMINISTERED:none  DRAINS: none   LOCAL MEDICATIONS USED:  MARCAINE     SPECIMEN:  No Specimen  DISPOSITION OF SPECIMEN:  N/A  COUNTS:  YES  TOURNIQUET:   Total Tourniquet Time Documented: Upper Arm (Left) - 18 minutes  DICTATION: .Other Dictation: Dictation Number [id number]  PLAN OF CARE: Discharge to home after PACU  PATIENT DISPOSITION:  PACU - hemodynamically stable.

## 2012-04-19 NOTE — Anesthesia Postprocedure Evaluation (Signed)
  Anesthesia Post-op Note  Patient: [name]  Procedure(s) Performed: Procedure(s) (LRB) with comments: CARPAL TUNNEL RELEASE (Left) - LT CTR Anesthesia: IV Regional FAB RELEASE TRIGGER FINGER/A-1 PULLEY (Left) - Release  A-1 Pulley Left Ring Finger   Patient Location: PACU  Anesthesia Type:General  Level of Consciousness: awake and alert   Airway and Oxygen Therapy: Patient Spontanous Breathing  Post-op Pain: mild  Post-op Assessment: Post-op Vital signs reviewed, Patient's Cardiovascular Status Stable, Respiratory Function Stable, Patent Airway, No signs of Nausea or vomiting and Pain level controlled  Post-op Vital Signs: stable  Complications: No apparent anesthesia complications

## 2012-04-21 ENCOUNTER — Encounter (HOSPITAL_BASED_OUTPATIENT_CLINIC_OR_DEPARTMENT_OTHER): Payer: Self-pay | Admitting: Orthopedic Surgery

## 2012-04-21 NOTE — Op Note (Signed)
NAMEVERDIE, BARROWS               ACCOUNT NO.:  [id number]  MEDICAL RECORD NO.:  [id number]  LOCATION:                                 FACILITY:  PHYSICIAN:  Cindee Salt, M.D.       DATE OF BIRTH:  07/16/1950  DATE OF PROCEDURE:  04/19/2012 DATE OF DISCHARGE:                              OPERATIVE REPORT   PREOPERATIVE DIAGNOSES:  Carpal tunnel syndrome, left hand; stenosing tenosynovitis, left ring finger.  POSTOPERATIVE DIAGNOSES:  Carpal tunnel syndrome, left hand; stenosing tenosynovitis, left ring finger.  OPERATION:  Release of A1 pulley, left ring finger; release of median nerve, left wrist.  SURGEON:  Cindee Salt, MD  ASSISTANT:  Betha Loa, MD  ANESTHESIA:  General with local infiltration.  ANESTHESIOLOGIST:  Bedelia Person, MD  HISTORY:  The patient is a 62 year old male with a history of triggering of his left ring finger, carpal tunnel syndrome, EMG nerve conductions positive.  Neither has responded to conservative treatment.  He has elected to undergo surgical release of both A1 pulley left ring finger, carpal tunnel syndrome left hand.  Pre, peri, and postoperative course have been discussed along with risks and complications.  He is aware that there is no guarantee with the surgery; possibility of infection; recurrence of injury to arteries, nerves, tendons, incomplete relief of symptoms, dystrophy.  In the preoperative area, the patient is seen, the extremity marked by both patient and surgeon.  Antibiotic given.  PROCEDURE:  The patient was brought to the operating room, where a general anesthetic was carried out without difficulty.  He was prepped using ChloraPrep, supine position, left arm free.  A 3-minute dry time was allowed.  Time-out taken, confirming patient and procedure.  The limb was exsanguinated with an Esmarch bandage.  Tourniquet placed on the upper arm was inflated to 250 mmHg.  Oblique incision was made over the A1 pulley of left ring finger,  carried down through subcutaneous tissue.  Bleeders were electrocauterized with bipolar.  The A1 pulley was identified.  This was released on its radial aspect.  A small incision was made centrally in A2.  The finger placed through a full range motion, no further triggering was noted.  There was no significant adherence to the tenosynovial tissue proximally.  The wound was irrigated and closed with interrupted 4-0 Vicryl Rapide sutures. Separate incision was then made longitudinally in the left palm, carried down through subcutaneous tissue.  Bleeders again electrocauterized with bipolar.  Palmar fascia was split.  Superficial palmar arch identified. To the ulnar side of median nerve, the carpal retinaculum was incised with sharp dissection.  Right angle and Sewall retractor were placed between skin and forearm fascia and the fascia released for approximately a centimeter and half to 2 cm proximal to the wrist crease under direct vision.  Canal was explored.  Area of compression to the nerve was apparent.  No further lesions were identified.  Motor branch was noted to enter into muscle.  Wound was copiously irrigated with saline and the skin closed with interrupted 4-0 Vicryl Rapide sutures. Local infiltration with 0.25% Marcaine without epinephrine was given, 10 mL total was used.  A sterile compressive dressing  with the fingers free was applied.  On deflation of the tourniquet, all fingers immediately pinked.  He was taken to the recovery room for observation in satisfactory condition.  He will be discharged home to return to the [facility] in 1 week on Vicodin.          ______________________________ Cindee Salt, M.D.     GK/MEDQ  D:  04/19/2012  T:  04/20/2012  Job:  [id number]

## 2012-06-04 ENCOUNTER — Other Ambulatory Visit: Payer: Self-pay

## 2013-02-23 ENCOUNTER — Other Ambulatory Visit: Payer: Self-pay

## 2013-10-23 ENCOUNTER — Encounter (INDEPENDENT_AMBULATORY_CARE_PROVIDER_SITE_OTHER): Payer: Self-pay | Admitting: Ophthalmology

## 2015-10-11 ENCOUNTER — Other Ambulatory Visit: Payer: Self-pay | Admitting: Orthopedic Surgery

## 2015-10-31 ENCOUNTER — Encounter (HOSPITAL_BASED_OUTPATIENT_CLINIC_OR_DEPARTMENT_OTHER): Payer: Self-pay | Admitting: *Deleted

## 2015-10-31 NOTE — Progress Notes (Signed)
Requested last office notes, Ekg from Dr. Luiz Ochoa. Pt is going to [employer] for [employer] and EKG tomorrow. Pt needs cardiac clearance - has 3 stents and on plavix.

## 2015-11-01 ENCOUNTER — Other Ambulatory Visit: Payer: Self-pay | Admitting: Orthopedic Surgery

## 2015-11-11 ENCOUNTER — Encounter (HOSPITAL_COMMUNITY)
Admission: RE | Admit: 2015-11-11 | Discharge: 2015-11-11 | Disposition: A | Discharge status: Home / Self Care | Payer: BLUE CROSS/BLUE SHIELD | Source: Ambulatory Visit | Attending: Orthopedic Surgery | Admitting: Orthopedic Surgery

## 2015-11-11 ENCOUNTER — Other Ambulatory Visit: Payer: Self-pay

## 2015-11-11 DIAGNOSIS — M65841 Other synovitis and tenosynovitis, right hand: Secondary | ICD-10-CM | POA: Diagnosis not present

## 2015-11-11 DIAGNOSIS — I10 Essential (primary) hypertension: Secondary | ICD-10-CM | POA: Diagnosis not present

## 2015-11-11 DIAGNOSIS — G4730 Sleep apnea, unspecified: Secondary | ICD-10-CM | POA: Diagnosis not present

## 2015-11-11 DIAGNOSIS — Z833 Family history of diabetes mellitus: Secondary | ICD-10-CM | POA: Diagnosis not present

## 2015-11-11 DIAGNOSIS — F419 Anxiety disorder, unspecified: Secondary | ICD-10-CM | POA: Diagnosis not present

## 2015-11-11 DIAGNOSIS — M1388 Other specified arthritis, other site: Secondary | ICD-10-CM | POA: Diagnosis not present

## 2015-11-11 DIAGNOSIS — F329 Major depressive disorder, single episode, unspecified: Secondary | ICD-10-CM | POA: Diagnosis not present

## 2015-11-11 DIAGNOSIS — E119 Type 2 diabetes mellitus without complications: Secondary | ICD-10-CM | POA: Diagnosis not present

## 2015-11-11 DIAGNOSIS — E785 Hyperlipidemia, unspecified: Secondary | ICD-10-CM | POA: Diagnosis not present

## 2015-11-11 DIAGNOSIS — K219 Gastro-esophageal reflux disease without esophagitis: Secondary | ICD-10-CM | POA: Diagnosis not present

## 2015-11-11 DIAGNOSIS — G5601 Carpal tunnel syndrome, right upper limb: Secondary | ICD-10-CM | POA: Diagnosis present

## 2015-11-11 DIAGNOSIS — M65341 Trigger finger, right ring finger: Secondary | ICD-10-CM | POA: Diagnosis not present

## 2015-11-11 DIAGNOSIS — Z87891 Personal history of nicotine dependence: Secondary | ICD-10-CM | POA: Diagnosis not present

## 2015-11-11 DIAGNOSIS — I252 Old myocardial infarction: Secondary | ICD-10-CM | POA: Diagnosis not present

## 2015-11-11 DIAGNOSIS — I2510 Atherosclerotic heart disease of native coronary artery without angina pectoris: Secondary | ICD-10-CM | POA: Diagnosis not present

## 2015-11-11 LAB — BASIC METABOLIC PANEL
Anion gap: 6 (ref 5–15)
BUN: 17 mg/dL (ref 6–20)
CALCIUM: 9.3 mg/dL (ref 8.9–10.3)
CO2: 23 mmol/L (ref 22–32)
CREATININE: 0.86 mg/dL (ref 0.61–1.24)
Chloride: 107 mmol/L (ref 101–111)
GFR calc non Af Amer: >60 mL/min (ref >60)
Glucose, Bld: 135 mg/dL — ABNORMAL HIGH (ref 65–99)
Potassium: 4.9 mmol/L (ref 3.5–5.1)
SODIUM: 136 mmol/L (ref 135–145)

## 2015-11-14 ENCOUNTER — Ambulatory Visit (HOSPITAL_BASED_OUTPATIENT_CLINIC_OR_DEPARTMENT_OTHER): Payer: BLUE CROSS/BLUE SHIELD | Admitting: Anesthesiology

## 2015-11-14 ENCOUNTER — Encounter (HOSPITAL_BASED_OUTPATIENT_CLINIC_OR_DEPARTMENT_OTHER)
Admission: RE | Disposition: A | Discharge status: Home / Self Care | Payer: Self-pay | Source: Ambulatory Visit | Attending: Orthopedic Surgery

## 2015-11-14 ENCOUNTER — Ambulatory Visit (HOSPITAL_BASED_OUTPATIENT_CLINIC_OR_DEPARTMENT_OTHER)
Admission: RE | Admit: 2015-11-14 | Discharge: 2015-11-14 | Disposition: A | Discharge status: Home / Self Care | Payer: BLUE CROSS/BLUE SHIELD | Source: Ambulatory Visit | Attending: Orthopedic Surgery | Admitting: Orthopedic Surgery

## 2015-11-14 ENCOUNTER — Encounter (HOSPITAL_BASED_OUTPATIENT_CLINIC_OR_DEPARTMENT_OTHER): Payer: Self-pay

## 2015-11-14 DIAGNOSIS — M65841 Other synovitis and tenosynovitis, right hand: Secondary | ICD-10-CM | POA: Insufficient documentation

## 2015-11-14 DIAGNOSIS — M1388 Other specified arthritis, other site: Secondary | ICD-10-CM | POA: Insufficient documentation

## 2015-11-14 DIAGNOSIS — E119 Type 2 diabetes mellitus without complications: Secondary | ICD-10-CM | POA: Insufficient documentation

## 2015-11-14 DIAGNOSIS — G5601 Carpal tunnel syndrome, right upper limb: Secondary | ICD-10-CM | POA: Insufficient documentation

## 2015-11-14 DIAGNOSIS — I2510 Atherosclerotic heart disease of native coronary artery without angina pectoris: Secondary | ICD-10-CM | POA: Insufficient documentation

## 2015-11-14 DIAGNOSIS — I252 Old myocardial infarction: Secondary | ICD-10-CM | POA: Diagnosis not present

## 2015-11-14 DIAGNOSIS — Z87891 Personal history of nicotine dependence: Secondary | ICD-10-CM | POA: Insufficient documentation

## 2015-11-14 DIAGNOSIS — G4730 Sleep apnea, unspecified: Secondary | ICD-10-CM | POA: Insufficient documentation

## 2015-11-14 DIAGNOSIS — M65341 Trigger finger, right ring finger: Secondary | ICD-10-CM | POA: Insufficient documentation

## 2015-11-14 DIAGNOSIS — F419 Anxiety disorder, unspecified: Secondary | ICD-10-CM | POA: Diagnosis not present

## 2015-11-14 DIAGNOSIS — K219 Gastro-esophageal reflux disease without esophagitis: Secondary | ICD-10-CM | POA: Diagnosis not present

## 2015-11-14 DIAGNOSIS — E785 Hyperlipidemia, unspecified: Secondary | ICD-10-CM | POA: Diagnosis not present

## 2015-11-14 DIAGNOSIS — F329 Major depressive disorder, single episode, unspecified: Secondary | ICD-10-CM | POA: Diagnosis not present

## 2015-11-14 DIAGNOSIS — Z833 Family history of diabetes mellitus: Secondary | ICD-10-CM | POA: Insufficient documentation

## 2015-11-14 DIAGNOSIS — I10 Essential (primary) hypertension: Secondary | ICD-10-CM | POA: Insufficient documentation

## 2015-11-14 HISTORY — DX: Calculus of kidney: N20.0

## 2015-11-14 HISTORY — DX: Anxiety disorder, unspecified: F41.9

## 2015-11-14 HISTORY — PX: TRIGGER FINGER RELEASE: SHX641

## 2015-11-14 HISTORY — DX: Depression, unspecified: F32.A

## 2015-11-14 HISTORY — DX: Type 2 diabetes mellitus without complications: E11.9

## 2015-11-14 HISTORY — PX: CARPAL TUNNEL RELEASE: SHX101

## 2015-11-14 HISTORY — DX: Major depressive disorder, single episode, unspecified: F32.9

## 2015-11-14 HISTORY — DX: Malignant (primary) neoplasm, unspecified: C80.1

## 2015-11-14 LAB — GLUCOSE, CAPILLARY
Glucose-Capillary: 118 mg/dL — ABNORMAL HIGH (ref 65–99)
Glucose-Capillary: 107 mg/dL — ABNORMAL HIGH (ref 65–99)

## 2015-11-14 SURGERY — CARPAL TUNNEL RELEASE
Anesthesia: Monitor Anesthesia Care | Site: Finger | Laterality: Right

## 2015-11-14 MED ORDER — SCOPOLAMINE 1 MG/3DAYS TD PT72: 1.0000 | MEDICATED_PATCH | Freq: Once | TRANSDERMAL | Status: DC | PRN

## 2015-11-14 MED ORDER — FENTANYL CITRATE (PF) 100 MCG/2ML IJ SOLN
INTRAMUSCULAR | Status: AC
  Filled 2015-11-14: qty 2

## 2015-11-14 MED ORDER — BUPIVACAINE HCL (PF) 0.25 % IJ SOLN
INTRAMUSCULAR | Status: AC
  Filled 2015-11-14: qty 120

## 2015-11-14 MED ORDER — SUCCINYLCHOLINE CHLORIDE 200 MG/10ML IV SOSY
PREFILLED_SYRINGE | INTRAVENOUS | Status: AC
  Filled 2015-11-14: qty 10

## 2015-11-14 MED ORDER — PHENYLEPHRINE 40 MCG/ML (10ML) SYRINGE FOR IV PUSH (FOR BLOOD PRESSURE SUPPORT)
PREFILLED_SYRINGE | INTRAVENOUS | Status: AC
  Filled 2015-11-14: qty 10

## 2015-11-14 MED ORDER — LIDOCAINE HCL (PF) 0.5 % IJ SOLN
INTRAMUSCULAR | Status: DC | PRN
  Administered 2015-11-14: 30 mL via INTRAVENOUS

## 2015-11-14 MED ORDER — EPHEDRINE 5 MG/ML INJ
INTRAVENOUS | Status: AC
  Filled 2015-11-14: qty 10

## 2015-11-14 MED ORDER — MIDAZOLAM HCL 2 MG/2ML IJ SOLN
1.0000 mg | INTRAMUSCULAR | Status: DC | PRN
  Administered 2015-11-14: 2 mg via INTRAVENOUS

## 2015-11-14 MED ORDER — ONDANSETRON HCL 4 MG/2ML IJ SOLN
INTRAMUSCULAR | Status: AC
  Filled 2015-11-14: qty 2

## 2015-11-14 MED ORDER — OXYCODONE HCL 5 MG PO TABS: 5.0000 mg | ORAL_TABLET | Freq: Once | ORAL | Status: DC | PRN

## 2015-11-14 MED ORDER — CEFAZOLIN SODIUM-DEXTROSE 2-4 GM/100ML-% IV SOLN
2.0000 g | INTRAVENOUS | Status: AC
  Administered 2015-11-14: 2 g via INTRAVENOUS

## 2015-11-14 MED ORDER — HYDROCODONE-ACETAMINOPHEN 5-325 MG PO TABS: 1.0000 | ORAL_TABLET | Freq: Four times a day (QID) | ORAL | 0 refills | Status: AC | PRN

## 2015-11-14 MED ORDER — CEFAZOLIN SODIUM-DEXTROSE 2-4 GM/100ML-% IV SOLN
INTRAVENOUS | Status: AC
Start: 2015-11-14 — End: 2015-11-14
  Filled 2015-11-14: qty 100

## 2015-11-14 MED ORDER — LACTATED RINGERS IV SOLN
INTRAVENOUS | Status: DC
  Administered 2015-11-14: 08:00:00 via INTRAVENOUS

## 2015-11-14 MED ORDER — PROPOFOL 500 MG/50ML IV EMUL
INTRAVENOUS | Status: AC
  Filled 2015-11-14: qty 50

## 2015-11-14 MED ORDER — MIDAZOLAM HCL 2 MG/2ML IJ SOLN
INTRAMUSCULAR | Status: AC
  Filled 2015-11-14: qty 2

## 2015-11-14 MED ORDER — OXYCODONE HCL 5 MG/5ML PO SOLN: 5.0000 mg | Freq: Once | ORAL | Status: DC | PRN

## 2015-11-14 MED ORDER — LIDOCAINE 2% (20 MG/ML) 5 ML SYRINGE
INTRAMUSCULAR | Status: AC
  Filled 2015-11-14: qty 5

## 2015-11-14 MED ORDER — ONDANSETRON HCL 4 MG/2ML IJ SOLN
4.0000 mg | Freq: Four times a day (QID) | INTRAMUSCULAR | Status: AC | PRN
  Administered 2015-11-14: 4 mg via INTRAVENOUS

## 2015-11-14 MED ORDER — CHLORHEXIDINE GLUCONATE 4 % EX LIQD: 60.0000 mL | Freq: Once | CUTANEOUS | Status: DC

## 2015-11-14 MED ORDER — FENTANYL CITRATE (PF) 100 MCG/2ML IJ SOLN
50.0000 ug | INTRAMUSCULAR | Status: DC | PRN
  Administered 2015-11-14: 100 ug via INTRAVENOUS

## 2015-11-14 MED ORDER — FENTANYL CITRATE (PF) 100 MCG/2ML IJ SOLN: 25.0000 ug | INTRAMUSCULAR | Status: DC | PRN

## 2015-11-14 MED ORDER — BUPIVACAINE HCL (PF) 0.25 % IJ SOLN
INTRAMUSCULAR | Status: DC | PRN
Start: 2015-11-14 — End: 2015-11-14
  Administered 2015-11-14: 10 mL

## 2015-11-14 MED ORDER — PROPOFOL 10 MG/ML IV BOLUS
INTRAVENOUS | Status: DC | PRN
  Administered 2015-11-14 (×2): 20 mg via INTRAVENOUS

## 2015-11-14 MED ORDER — GLYCOPYRROLATE 0.2 MG/ML IJ SOLN: 0.2000 mg | Freq: Once | INTRAMUSCULAR | Status: DC | PRN

## 2015-11-14 SURGICAL SUPPLY — 37 items
BANDAGE COBAN STERILE 2 (GAUZE/BANDAGES/DRESSINGS) IMPLANT
BLADE SURG 15 STRL LF DISP TIS (BLADE) ×1 IMPLANT
BLADE SURG 15 STRL SS (BLADE) ×2
BNDG COHESIVE 3X5 TAN STRL LF (GAUZE/BANDAGES/DRESSINGS) ×3 IMPLANT
BNDG ESMARK 4X9 LF (GAUZE/BANDAGES/DRESSINGS) IMPLANT
BNDG GAUZE ELAST 4 BULKY (GAUZE/BANDAGES/DRESSINGS) ×3 IMPLANT
CHLORAPREP W/TINT 26ML (MISCELLANEOUS) ×3 IMPLANT
CORDS BIPOLAR (ELECTRODE) ×3 IMPLANT
COVER BACK TABLE 60X90IN (DRAPES) ×3 IMPLANT
COVER MAYO STAND STRL (DRAPES) ×3 IMPLANT
CUFF TOURNIQUET SINGLE 18IN (TOURNIQUET CUFF) ×3 IMPLANT
DECANTER SPIKE VIAL GLASS SM (MISCELLANEOUS) IMPLANT
DRAPE EXTREMITY T 121X128X90 (DRAPE) ×3 IMPLANT
DRAPE SURG 17X23 STRL (DRAPES) ×3 IMPLANT
DRSG PAD ABDOMINAL 8X10 ST (GAUZE/BANDAGES/DRESSINGS) ×3 IMPLANT
GAUZE SPONGE 4X4 12PLY STRL (GAUZE/BANDAGES/DRESSINGS) ×3 IMPLANT
GAUZE XEROFORM 1X8 LF (GAUZE/BANDAGES/DRESSINGS) ×3 IMPLANT
GLOVE BIO SURGEON STRL SZ7.5 (GLOVE) ×3 IMPLANT
GLOVE BIOGEL PI IND STRL 7.0 (GLOVE) ×1 IMPLANT
GLOVE BIOGEL PI IND STRL 8.5 (GLOVE) ×1 IMPLANT
GLOVE BIOGEL PI INDICATOR 7.0 (GLOVE) ×2
GLOVE BIOGEL PI INDICATOR 8.5 (GLOVE) ×2
GLOVE ECLIPSE 6.5 STRL STRAW (GLOVE) ×3 IMPLANT
GLOVE SURG ORTHO 8.0 STRL STRW (GLOVE) ×3 IMPLANT
GOWN STRL REUS W/ TWL LRG LVL3 (GOWN DISPOSABLE) ×1 IMPLANT
GOWN STRL REUS W/TWL LRG LVL3 (GOWN DISPOSABLE) ×2
GOWN STRL REUS W/TWL XL LVL3 (GOWN DISPOSABLE) ×3 IMPLANT
NEEDLE PRECISIONGLIDE 27X1.5 (NEEDLE) ×3 IMPLANT
NS IRRIG 1000ML POUR BTL (IV SOLUTION) ×3 IMPLANT
PACK BASIN DAY SURGERY FS (CUSTOM PROCEDURE TRAY) ×3 IMPLANT
STOCKINETTE 4X48 STRL (DRAPES) ×3 IMPLANT
SUT ETHILON 4 0 PS 2 18 (SUTURE) ×3 IMPLANT
SUT VICRYL 4-0 PS2 18IN ABS (SUTURE) IMPLANT
SYR BULB 3OZ (MISCELLANEOUS) ×3 IMPLANT
SYR CONTROL 10ML LL (SYRINGE) ×3 IMPLANT
TOWEL OR 17X24 6PK STRL BLUE (TOWEL DISPOSABLE) ×3 IMPLANT
UNDERPAD 30X30 (UNDERPADS AND DIAPERS) IMPLANT

## 2015-11-14 NOTE — Op Note (Signed)
See op note

## 2015-11-14 NOTE — H&P (Signed)
[name] is an 65 y.o. male.   Chief Complaint: numbness right hand and catching right ring finger HPI: [name] is a 65 year old right hand dominant male. [name] He comes in with a complaint of aching pain in his right arm, his lateral side. Occasionally to the ring and small finger with numbness and tingling remaining. He is awakened 3/7 nights. He has no history of injury to his hand, elbow or neck. 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 He has had shoulder done by Dr. Mardelle Matte who has referred him. He takes Voltaren, which has helped. He states his pain score is relatively low. It is primarily numbness and tingling, which is causing his pain. Nothing seems to make this better or worse for him. He has a history of diabetes, arthritis. No history of thyroid problems or gout. Family history is positive for diabetes and arthritis. Negative for thyroid problems or gout. He does not wear splints. He takes Voltaren irregularly.He has had a carpal tunnel release done on his left side along with the trigger finger on his left side, which has done very well. He has seen Dr. [name] who has recommended surgical intervention with respect to his neck, C2-3 and C5-6. He has had his conductions done by Dr. [name]. This reveals that he has a carpal tunnel syndrome on his right side with a prolonged latency and reduced amplitude to the sensory component.      Past Medical History:  Diagnosis Date  . Diabetes mellitus type I ([place])  . Hyperlipemia  . Hypertension   Past Surgical History:  Procedure Laterality Date  . CARDIAC SURGERY  . CHOLECYSTECTOMY  . HAND SURGERY  . left mastoid  . SHOULDER SURGERY  . STENT x3  . TONSILLECTOMY AND ADENOIDECTOMY       Past Medical History:  Diagnosis Date  . Anxiety   . Arthritis   . Cancer ([place])    Squamous cell off nose and chest  . Coronary artery disease    Has 3 stents  . Depression   . Diabetes mellitus without complication ([place])   .  GERD (gastroesophageal reflux disease)   . Hypertension   . Kidney stones    X 2  . Myocardial infarction ([place])   . Neuromuscular disorder ([place])    carpal tunnel synd  . Sleep apnea    uses a cpap nightly  . Wears glasses   . Wears partial dentures    lower partial    Past Surgical History:  Procedure Laterality Date  . CARDIAC CATHETERIZATION  2011   stent  . CARPAL TUNNEL RELEASE  04/19/2012   Procedure: CARPAL TUNNEL RELEASE;  Surgeon: [name], MD;  Location: [place];  Service: Orthopedics;  Laterality: Left;  LT CTR Anesthesia: IV Regional FAB  . CHOLECYSTECTOMY    . COLONOSCOPY W/ BIOPSIES    . EYE SURGERY  3/12   rt eye-retinax2  . EYE SURGERY  06,07   lt eye cyst  . GREAT TOE ARTHRODESIS, INTERPHALANGEAL JOINT     lt  . MASTOIDECTOMY    . Right rotator cuff surgery     1992  . Right shoulder surgery     03/1999 - Dr. Mardelle Matte  . TONSILLECTOMY    . TRIGGER FINGER RELEASE  04/19/2012   Procedure: RELEASE TRIGGER FINGER/A-1 PULLEY;  Surgeon: [name], MD;  Location: [place];  Service: Orthopedics;  Laterality: Left;  Release  A-1 Pulley Left Ring  Finger     History reviewed. No pertinent family history. Social History:  reports that he quit smoking about 5 years ago. He does not have any smokeless tobacco history on file. He reports that he does not drink alcohol or use drugs.  Allergies: No Known Allergies  No prescriptions prior to admission.    No results found for this or any previous visit (from the past 48 hour(s)).  No results found.   Pertinent items are noted in HPI.  Height 5\' 8"  (1.727 m), weight 112 kg (247 lb).  General appearance: alert, cooperative and appears stated age Head: Normocephalic, without obvious abnormality Neck: no JVD Resp: clear to auscultation bilaterally Cardio: regular rate and rhythm, S1, S2 normal, no murmur, click, rub or gallop GI: soft non tender Extremities: numbness  right hand and catching ring finger Pulses: 2+ and symmetric Skin: Skin color, texture, turgor normal. No rashes or lesions Neurologic: Grossly normal Incision/Wound: na  Assessment/Plan Diagnosis: Trigger finger, right ring finger.  carpal tunnel syndrome PLAN: He is desirous of proceeding to have the right side released. He is scheduled for right carpal tunnel release. We will release A1 pulley, right ring finger. Pre, peri and postoperative course has been discussed along with risks and complications. He is aware that there is no guarantee with the surgery, the possibility of infection, recurrence, injury to arteries, nerves, tendons, incomplete relief of symptoms distally. This is scheduled as an outpatient under regional anesthesia.         [name] R 11/14/2015, 5:27 AM

## 2015-11-14 NOTE — Transfer of Care (Signed)
Immediate Anesthesia Transfer of Care Note  Patient: [name]  Procedure(s) Performed: Procedure(s) with comments: RIGHT CARPAL TUNNEL RELEASE (Right) - ANESTHESIA: IV REGIONAL FAB RELEASE A-1 PULLEY RIGHT RING FINGER (Right)  Patient Location: PACU  Anesthesia Type:MAC and Bier block  Level of Consciousness: awake, alert  and oriented  Airway & Oxygen Therapy: Patient Spontanous Breathing and Patient connected to face mask oxygen  Post-op Assessment: Report given to RN and Post -op Vital signs reviewed and stable  Post vital signs: Reviewed and stable  Last Vitals:  Vitals:   11/14/15 0732  BP: 118/60  Pulse: 70  Resp: 18  Temp: 36.9 C    Last Pain:  Vitals:   11/14/15 0732  TempSrc: Oral  PainSc: 5       Patients Stated Pain Goal: 1 ([id number])  Complications: No apparent anesthesia complications

## 2015-11-14 NOTE — Brief Op Note (Signed)
11/14/2015  10:00 AM  PATIENT:  [name]  65 y.o. male  PRE-OPERATIVE DIAGNOSIS:  RIGHT CARPAL TUNNEL SYNDROME, TRIGGER RIGHT RING FINGER  POST-OPERATIVE DIAGNOSIS:  RIGHT CARPAL TUNNEL SYNDROME, TRIGGER RIGHT RING FINGER  PROCEDURE:  Procedure(s) with comments: RIGHT CARPAL TUNNEL RELEASE (Right) - ANESTHESIA: IV REGIONAL FAB RELEASE A-1 PULLEY RIGHT RING FINGER (Right)  SURGEON:  Surgeon(s) and Role:    * Daryll Brod, MD - Primary  PHYSICIAN ASSISTANT:   ASSISTANTS: none   ANESTHESIA:   local and regional  EBL:  Total I/O In: -  Out: 5 [Blood:5]  BLOOD ADMINISTERED:none  DRAINS: none   LOCAL MEDICATIONS USED:  BUPIVICAINE   SPECIMEN:  No Specimen  DISPOSITION OF SPECIMEN:  PATHOLOGY  COUNTS:  YES  TOURNIQUET:   Total Tourniquet Time Documented: Forearm (Right) - 29 minutes Total: Forearm (Right) - 29 minutes   DICTATION: .Other Dictation: Dictation Number Operative note completed oral ER [name] is a 39 biopsy of a [id number] preoperative diagnosis carpal tunnel syndrome right hand. The same operation release right hand with release A-1 pulley right ring finger surgically anesthesia for base IV regional with local infiltration date of operation 11/14/2015 anesthesiologist the urine history the patient is a 65 year old male with a history of carpal tunnel syndrome EMG nerve positive with responded to conservative treatment along with trigger right ring finger he is admitted now for release of the nerve with carpal tunnel release right hand.. Postoperative was discussed along with the complications he is aware that there is no curative the surgical infection were presented was admitted with history preoperative the patient seen by both surgeon and chronic procedure the patient brought to the operating room where for anxiety carried out without difficulty was prepped using ChloraPrep supine position with the right arm for 3 minute dry time was allowed, pigmented from  patient seizure the ring finger was attended to first oblique incision made over the A1 pulley the right ventricular this dissection was carried out to the A1 pulley retractors were placed protect the neurovascular bundle radially and ulnarly deviated pole was released on its radial aspect made centrally in a tube partial synovectomy performed proximally with separation of the 2 tendons to remove any adhesions that there was placed in full passive range of motion over the treatment was noted the wound was irrigated and closed with 4-0 nylon sutures a separate incision was then made on the mid palm carried down through subcutaneous tissue bleeders electrocauterized with bipolar lumbar fascia was split proficient palmar arch was identified the flexor tendon to the right little finger identified the ulnar side of the median nerve proper was incised with sharp dissection after placing retractors protecting the median and ulnar nerve I right angle and stool retractor placed skin for fascia a blunt dissection separated to the nerve was separated off the using blunt dissection fashion dorsally and the fascia was then released for approximately 2-3 cm proximal to the wrist crease under direct vision canal was explored. Compression of the nerve is apparent persistent median artery was present no further lesions were identified the motor branch entered in the muscle distally the wound was irrigated and closed with interrupted 4-0 nylon sutures a local infiltration with 1% Xylocaine without epinephrine. Approximately 8 mL views a sterile compressive dressing was applied and inflated the tourniquet THE patient was taken to recover from patient in satisfactory condition will be discharged home. He has suffered from 1 week Norco this b  PLAN OF CARE: Discharge to  home after PACU  PATIENT DISPOSITION:  PACU - hemodynamically stable.

## 2015-11-14 NOTE — Anesthesia Preprocedure Evaluation (Signed)
Anesthesia Evaluation    Airway Mallampati: II   Neck ROM: full    Dental   Pulmonary sleep apnea , former smoker,    breath sounds clear to auscultation       Cardiovascular hypertension, + CAD and + Past MI   Rhythm:regular Rate:Normal  Cleared by cardiology.  Pt has no active cardiac symptoms.   Neuro/Psych Anxiety Depression  Neuromuscular disease    GI/Hepatic GERD  ,  Endo/Other  diabetes, Type 2  Renal/GU      Musculoskeletal  (+) Arthritis ,   Abdominal   Peds  Hematology   Anesthesia Other Findings   Reproductive/Obstetrics                             Anesthesia Physical Anesthesia Plan  ASA: III  Anesthesia Plan: MAC and Bier Block   Post-op Pain Management:    Induction: Intravenous  Airway Management Planned: Simple Face Mask  Additional Equipment:   Intra-op Plan:   Post-operative Plan:   Informed Consent: I have reviewed the patients History and Physical, chart, labs and discussed the procedure including the risks, benefits and alternatives for the proposed anesthesia with the patient or authorized representative who has indicated his/her understanding and acceptance.     Plan Discussed with: CRNA, Anesthesiologist and Surgeon  Anesthesia Plan Comments:         Anesthesia Quick Evaluation

## 2015-11-14 NOTE — Addendum Note (Signed)
Addendum  created 11/14/15 1039 by Willa Frater, CRNA   Anesthesia Intra Flowsheets edited, Anesthesia Intra Meds edited

## 2015-11-14 NOTE — Anesthesia Postprocedure Evaluation (Signed)
Anesthesia Post Note  Patient: [name]  Procedure(s) Performed: Procedure(s) (LRB): RIGHT CARPAL TUNNEL RELEASE (Right) RELEASE A-1 PULLEY RIGHT RING FINGER (Right)  Patient location during evaluation: PACU Anesthesia Type: MAC Level of consciousness: awake and alert Pain management: pain level controlled Vital Signs Assessment: post-procedure vital signs reviewed and stable Respiratory status: spontaneous breathing, nonlabored ventilation, respiratory function stable and patient connected to nasal cannula oxygen Cardiovascular status: stable and blood pressure returned to baseline Anesthetic complications: no    Last Vitals:  Vitals:   11/14/15 1003 11/14/15 1015  BP: 119/78 101/70  Pulse: 75 63  Resp: 12 18  Temp: 36.6 C     Last Pain:  Vitals:   11/14/15 1015  TempSrc:   PainSc: 0-No pain                 Tekelia Kareem S

## 2015-11-14 NOTE — Op Note (Signed)
[name], [name]               ACCOUNT NO.:  [id number]  MEDICAL RECORD NO.:  [id number]  LOCATION:                                 FACILITY:  PHYSICIAN:  Daryll Brod, M.D.       DATE OF BIRTH:  03-05-1951  DATE OF PROCEDURE:  11/14/2015 DATE OF DISCHARGE:                              OPERATIVE REPORT   PREOPERATIVE DIAGNOSIS:  Carpal tunnel syndrome, right hand, stenosing tenosynovitis, right ring finger.  POSTOPERATIVE DIAGNOSIS:  Carpal tunnel syndrome, right hand, stenosing tenosynovitis, right ring finger.  OPERATION:  Release of median nerve, carpal canal, right hand with release of A1 pulley, right ring finger.  SURGEON:  Daryll Brod, M.D.  ANESTHESIA:  Forearm-based IV regional with local infiltration.  ANESTHESIOLOGIST:  Albertha Ghee, MD  HISTORY:  The patient is a 65 year old male with a history of carpal tunnel syndrome.  EMG nerve conduction is positive which has not responded to conservative treatment along with triggering of his right ring finger.  He is admitted now for release of the median nerve with carpal tunnel release right hand.  Pre, peri, and postoperative course have been discussed along with risks and complications.  He is aware that there is no guarantee with the surgery; the possibility of infection; recurrence of injury to arteries, nerves, tendons; incomplete relief of symptoms; and dystrophy.  In the preoperative area, the patient was seen, the extremity marked by both patient and surgeon. Antibiotic given.  PROCEDURE IN DETAIL:  The patient was brought to the operating room, where a forearm-based IV regional anesthetic was carried out without difficulty.  He was prepped using ChloraPrep, supine position with the right arm free.  A 3-minute dry time was allowed.  Time-out taken, confirming the patient and procedure.  The ring finger was attended to 1st an oblique incision made over the A1 pulley of the right ring finger.  This dissection  was carried down to the A1 pulley.  Retractors were placed protecting neurovascular bundles radially and ulnarly.  The A1 pulley was released on its radial aspect.  A small incision was made centrally in A2.  Partial tenosynovectomy performed proximally with separation of the 2 tendons to remove any adhesions.  The finger was placed through a full passive range motion, no further triggering was noted.  The wound was irrigated and closed with interrupted 4-0 nylon sutures.  A separate incision was then made on the mid palm, carried down through subcutaneous tissue.  Bleeders again electrocauterized with bipolar.  The palmar fascia was split.  The superficial palmar arch was identified.  The flexor tendon to the ring and little finger identified to the ulnar side of the median nerve.  The carpal retinaculum was incised with sharp dissection after placing retractors protecting both median and ulnar nerves.  A right-angle and Sewall retractor were placed between skin and forearm fascia.  The nerve was separated off and then using blunt dissection from the fascia dorsally and the fascia was then released for approximately 2-3 cm proximal to the wrist crease under direct vision.  Canal was explored.  Area of compression to the nerve was apparent.  Persistent median artery was present.  No  further lesions were identified.  The motor branch entered into muscle distally.  The wound was irrigated and closed with interrupted 4-0 nylon sutures.  A local infiltration with 0.25% bupivacaine without epinephrine was given, approximately 8 mL was used.  A sterile compressive dressing with the fingers free was applied.  On deflation of the tourniquet, all fingers immediately pinked.  He was taken to the recovery room for observation in satisfactory condition.  He will be discharged home to return to the [facility] in 1 week, on [place].          ______________________________ Daryll Brod,  M.D.     GK/MEDQ  D:  11/14/2015  T:  11/14/2015  Job:  [id number]

## 2015-11-14 NOTE — Discharge Instructions (Addendum)

## 2015-11-18 ENCOUNTER — Encounter (HOSPITAL_BASED_OUTPATIENT_CLINIC_OR_DEPARTMENT_OTHER): Payer: Self-pay | Admitting: Orthopedic Surgery

## 2016-01-14 DIAGNOSIS — E7800 Pure hypercholesterolemia, unspecified: Secondary | ICD-10-CM | POA: Diagnosis not present

## 2016-01-14 DIAGNOSIS — N182 Chronic kidney disease, stage 2 (mild): Secondary | ICD-10-CM | POA: Diagnosis not present

## 2016-01-14 DIAGNOSIS — E1122 Type 2 diabetes mellitus with diabetic chronic kidney disease: Secondary | ICD-10-CM | POA: Diagnosis not present

## 2016-01-14 DIAGNOSIS — I10 Essential (primary) hypertension: Secondary | ICD-10-CM | POA: Diagnosis not present

## 2016-01-14 DIAGNOSIS — Z6839 Body mass index (BMI) 39.0-39.9, adult: Secondary | ICD-10-CM | POA: Diagnosis not present

## 2016-03-23 DIAGNOSIS — M25511 Pain in right shoulder: Secondary | ICD-10-CM | POA: Diagnosis not present

## 2016-04-29 DIAGNOSIS — Z299 Encounter for prophylactic measures, unspecified: Secondary | ICD-10-CM | POA: Diagnosis not present

## 2016-04-29 DIAGNOSIS — R5383 Other fatigue: Secondary | ICD-10-CM | POA: Diagnosis not present

## 2016-04-29 DIAGNOSIS — E1122 Type 2 diabetes mellitus with diabetic chronic kidney disease: Secondary | ICD-10-CM | POA: Diagnosis not present

## 2016-04-29 DIAGNOSIS — Z125 Encounter for screening for malignant neoplasm of prostate: Secondary | ICD-10-CM | POA: Diagnosis not present

## 2016-04-29 DIAGNOSIS — N182 Chronic kidney disease, stage 2 (mild): Secondary | ICD-10-CM | POA: Diagnosis not present

## 2016-04-29 DIAGNOSIS — E7800 Pure hypercholesterolemia, unspecified: Secondary | ICD-10-CM | POA: Diagnosis not present

## 2016-04-29 DIAGNOSIS — Z6838 Body mass index (BMI) 38.0-38.9, adult: Secondary | ICD-10-CM | POA: Diagnosis not present

## 2016-04-29 DIAGNOSIS — Z79899 Other long term (current) drug therapy: Secondary | ICD-10-CM | POA: Diagnosis not present

## 2016-04-29 DIAGNOSIS — Z1211 Encounter for screening for malignant neoplasm of colon: Secondary | ICD-10-CM | POA: Diagnosis not present

## 2016-04-29 DIAGNOSIS — Z0000 Encounter for general adult medical examination without abnormal findings: Secondary | ICD-10-CM | POA: Diagnosis not present

## 2016-04-29 DIAGNOSIS — Z1389 Encounter for screening for other disorder: Secondary | ICD-10-CM | POA: Diagnosis not present

## 2016-04-29 DIAGNOSIS — Z7189 Other specified counseling: Secondary | ICD-10-CM | POA: Diagnosis not present

## 2016-06-10 DIAGNOSIS — I2510 Atherosclerotic heart disease of native coronary artery without angina pectoris: Secondary | ICD-10-CM | POA: Diagnosis not present

## 2016-06-10 DIAGNOSIS — N182 Chronic kidney disease, stage 2 (mild): Secondary | ICD-10-CM | POA: Diagnosis not present

## 2016-06-10 DIAGNOSIS — Z87891 Personal history of nicotine dependence: Secondary | ICD-10-CM | POA: Diagnosis not present

## 2016-06-10 DIAGNOSIS — Z299 Encounter for prophylactic measures, unspecified: Secondary | ICD-10-CM | POA: Diagnosis not present

## 2016-06-10 DIAGNOSIS — E1122 Type 2 diabetes mellitus with diabetic chronic kidney disease: Secondary | ICD-10-CM | POA: Diagnosis not present

## 2016-06-10 DIAGNOSIS — I10 Essential (primary) hypertension: Secondary | ICD-10-CM | POA: Diagnosis not present

## 2016-06-10 DIAGNOSIS — Z713 Dietary counseling and surveillance: Secondary | ICD-10-CM | POA: Diagnosis not present

## 2016-06-10 DIAGNOSIS — Z6838 Body mass index (BMI) 38.0-38.9, adult: Secondary | ICD-10-CM | POA: Diagnosis not present

## 2016-06-12 DIAGNOSIS — M25511 Pain in right shoulder: Secondary | ICD-10-CM | POA: Diagnosis not present

## 2016-06-16 DIAGNOSIS — R3912 Poor urinary stream: Secondary | ICD-10-CM | POA: Diagnosis not present

## 2016-06-16 DIAGNOSIS — N200 Calculus of kidney: Secondary | ICD-10-CM | POA: Diagnosis not present

## 2016-06-16 DIAGNOSIS — N401 Enlarged prostate with lower urinary tract symptoms: Secondary | ICD-10-CM | POA: Diagnosis not present

## 2016-06-16 DIAGNOSIS — R3915 Urgency of urination: Secondary | ICD-10-CM | POA: Diagnosis not present

## 2016-06-16 DIAGNOSIS — R350 Frequency of micturition: Secondary | ICD-10-CM | POA: Diagnosis not present

## 2016-06-16 DIAGNOSIS — R3913 Splitting of urinary stream: Secondary | ICD-10-CM | POA: Diagnosis not present

## 2016-06-23 DIAGNOSIS — H04123 Dry eye syndrome of bilateral lacrimal glands: Secondary | ICD-10-CM | POA: Diagnosis not present

## 2016-06-23 DIAGNOSIS — H35433 Paving stone degeneration of retina, bilateral: Secondary | ICD-10-CM | POA: Diagnosis not present

## 2016-06-23 DIAGNOSIS — H35371 Puckering of macula, right eye: Secondary | ICD-10-CM | POA: Diagnosis not present

## 2016-06-23 DIAGNOSIS — E119 Type 2 diabetes mellitus without complications: Secondary | ICD-10-CM | POA: Diagnosis not present

## 2016-06-23 DIAGNOSIS — H524 Presbyopia: Secondary | ICD-10-CM | POA: Diagnosis not present

## 2016-06-24 DIAGNOSIS — N182 Chronic kidney disease, stage 2 (mild): Secondary | ICD-10-CM | POA: Diagnosis not present

## 2016-06-24 DIAGNOSIS — Z6837 Body mass index (BMI) 37.0-37.9, adult: Secondary | ICD-10-CM | POA: Diagnosis not present

## 2016-06-24 DIAGNOSIS — Z87891 Personal history of nicotine dependence: Secondary | ICD-10-CM | POA: Diagnosis not present

## 2016-06-24 DIAGNOSIS — E1122 Type 2 diabetes mellitus with diabetic chronic kidney disease: Secondary | ICD-10-CM | POA: Diagnosis not present

## 2016-06-24 DIAGNOSIS — Z713 Dietary counseling and surveillance: Secondary | ICD-10-CM | POA: Diagnosis not present

## 2016-06-24 DIAGNOSIS — Z299 Encounter for prophylactic measures, unspecified: Secondary | ICD-10-CM | POA: Diagnosis not present

## 2016-06-24 DIAGNOSIS — E7800 Pure hypercholesterolemia, unspecified: Secondary | ICD-10-CM | POA: Diagnosis not present

## 2016-07-22 DIAGNOSIS — M9902 Segmental and somatic dysfunction of thoracic region: Secondary | ICD-10-CM | POA: Diagnosis not present

## 2016-07-22 DIAGNOSIS — M5033 Other cervical disc degeneration, cervicothoracic region: Secondary | ICD-10-CM | POA: Diagnosis not present

## 2016-07-22 DIAGNOSIS — M9903 Segmental and somatic dysfunction of lumbar region: Secondary | ICD-10-CM | POA: Diagnosis not present

## 2016-07-22 DIAGNOSIS — M9901 Segmental and somatic dysfunction of cervical region: Secondary | ICD-10-CM | POA: Diagnosis not present

## 2016-07-27 DIAGNOSIS — M9901 Segmental and somatic dysfunction of cervical region: Secondary | ICD-10-CM | POA: Diagnosis not present

## 2016-07-27 DIAGNOSIS — M9903 Segmental and somatic dysfunction of lumbar region: Secondary | ICD-10-CM | POA: Diagnosis not present

## 2016-07-27 DIAGNOSIS — M5033 Other cervical disc degeneration, cervicothoracic region: Secondary | ICD-10-CM | POA: Diagnosis not present

## 2016-07-27 DIAGNOSIS — M9902 Segmental and somatic dysfunction of thoracic region: Secondary | ICD-10-CM | POA: Diagnosis not present

## 2016-07-28 DIAGNOSIS — M5033 Other cervical disc degeneration, cervicothoracic region: Secondary | ICD-10-CM | POA: Diagnosis not present

## 2016-07-28 DIAGNOSIS — M9902 Segmental and somatic dysfunction of thoracic region: Secondary | ICD-10-CM | POA: Diagnosis not present

## 2016-07-28 DIAGNOSIS — M9903 Segmental and somatic dysfunction of lumbar region: Secondary | ICD-10-CM | POA: Diagnosis not present

## 2016-07-28 DIAGNOSIS — M9901 Segmental and somatic dysfunction of cervical region: Secondary | ICD-10-CM | POA: Diagnosis not present

## 2016-07-30 DIAGNOSIS — M5033 Other cervical disc degeneration, cervicothoracic region: Secondary | ICD-10-CM | POA: Diagnosis not present

## 2016-07-30 DIAGNOSIS — M9903 Segmental and somatic dysfunction of lumbar region: Secondary | ICD-10-CM | POA: Diagnosis not present

## 2016-07-30 DIAGNOSIS — M9901 Segmental and somatic dysfunction of cervical region: Secondary | ICD-10-CM | POA: Diagnosis not present

## 2016-07-30 DIAGNOSIS — M9902 Segmental and somatic dysfunction of thoracic region: Secondary | ICD-10-CM | POA: Diagnosis not present

## 2016-08-03 DIAGNOSIS — M9902 Segmental and somatic dysfunction of thoracic region: Secondary | ICD-10-CM | POA: Diagnosis not present

## 2016-08-03 DIAGNOSIS — M9903 Segmental and somatic dysfunction of lumbar region: Secondary | ICD-10-CM | POA: Diagnosis not present

## 2016-08-03 DIAGNOSIS — M5033 Other cervical disc degeneration, cervicothoracic region: Secondary | ICD-10-CM | POA: Diagnosis not present

## 2016-08-03 DIAGNOSIS — M9901 Segmental and somatic dysfunction of cervical region: Secondary | ICD-10-CM | POA: Diagnosis not present

## 2016-08-04 DIAGNOSIS — I10 Essential (primary) hypertension: Secondary | ICD-10-CM | POA: Diagnosis not present

## 2016-08-04 DIAGNOSIS — K219 Gastro-esophageal reflux disease without esophagitis: Secondary | ICD-10-CM | POA: Diagnosis not present

## 2016-08-04 DIAGNOSIS — Z299 Encounter for prophylactic measures, unspecified: Secondary | ICD-10-CM | POA: Diagnosis not present

## 2016-08-04 DIAGNOSIS — Z6836 Body mass index (BMI) 36.0-36.9, adult: Secondary | ICD-10-CM | POA: Diagnosis not present

## 2016-08-04 DIAGNOSIS — N182 Chronic kidney disease, stage 2 (mild): Secondary | ICD-10-CM | POA: Diagnosis not present

## 2016-08-04 DIAGNOSIS — E1122 Type 2 diabetes mellitus with diabetic chronic kidney disease: Secondary | ICD-10-CM | POA: Diagnosis not present

## 2016-08-04 DIAGNOSIS — I2510 Atherosclerotic heart disease of native coronary artery without angina pectoris: Secondary | ICD-10-CM | POA: Diagnosis not present

## 2016-08-04 DIAGNOSIS — M549 Dorsalgia, unspecified: Secondary | ICD-10-CM | POA: Diagnosis not present

## 2016-08-04 DIAGNOSIS — G4733 Obstructive sleep apnea (adult) (pediatric): Secondary | ICD-10-CM | POA: Diagnosis not present

## 2016-08-04 DIAGNOSIS — E7800 Pure hypercholesterolemia, unspecified: Secondary | ICD-10-CM | POA: Diagnosis not present

## 2016-08-04 DIAGNOSIS — Z87891 Personal history of nicotine dependence: Secondary | ICD-10-CM | POA: Diagnosis not present

## 2016-08-04 DIAGNOSIS — L309 Dermatitis, unspecified: Secondary | ICD-10-CM | POA: Diagnosis not present

## 2016-08-05 DIAGNOSIS — M9902 Segmental and somatic dysfunction of thoracic region: Secondary | ICD-10-CM | POA: Diagnosis not present

## 2016-08-05 DIAGNOSIS — M9903 Segmental and somatic dysfunction of lumbar region: Secondary | ICD-10-CM | POA: Diagnosis not present

## 2016-08-05 DIAGNOSIS — M5033 Other cervical disc degeneration, cervicothoracic region: Secondary | ICD-10-CM | POA: Diagnosis not present

## 2016-08-05 DIAGNOSIS — M9901 Segmental and somatic dysfunction of cervical region: Secondary | ICD-10-CM | POA: Diagnosis not present

## 2016-08-06 DIAGNOSIS — M9901 Segmental and somatic dysfunction of cervical region: Secondary | ICD-10-CM | POA: Diagnosis not present

## 2016-08-06 DIAGNOSIS — M9902 Segmental and somatic dysfunction of thoracic region: Secondary | ICD-10-CM | POA: Diagnosis not present

## 2016-08-06 DIAGNOSIS — M9903 Segmental and somatic dysfunction of lumbar region: Secondary | ICD-10-CM | POA: Diagnosis not present

## 2016-08-06 DIAGNOSIS — M5033 Other cervical disc degeneration, cervicothoracic region: Secondary | ICD-10-CM | POA: Diagnosis not present

## 2016-08-11 DIAGNOSIS — M5033 Other cervical disc degeneration, cervicothoracic region: Secondary | ICD-10-CM | POA: Diagnosis not present

## 2016-08-11 DIAGNOSIS — M9903 Segmental and somatic dysfunction of lumbar region: Secondary | ICD-10-CM | POA: Diagnosis not present

## 2016-08-11 DIAGNOSIS — M9901 Segmental and somatic dysfunction of cervical region: Secondary | ICD-10-CM | POA: Diagnosis not present

## 2016-08-11 DIAGNOSIS — M9902 Segmental and somatic dysfunction of thoracic region: Secondary | ICD-10-CM | POA: Diagnosis not present

## 2016-08-13 DIAGNOSIS — M9903 Segmental and somatic dysfunction of lumbar region: Secondary | ICD-10-CM | POA: Diagnosis not present

## 2016-08-13 DIAGNOSIS — M9901 Segmental and somatic dysfunction of cervical region: Secondary | ICD-10-CM | POA: Diagnosis not present

## 2016-08-13 DIAGNOSIS — M5033 Other cervical disc degeneration, cervicothoracic region: Secondary | ICD-10-CM | POA: Diagnosis not present

## 2016-08-13 DIAGNOSIS — M9902 Segmental and somatic dysfunction of thoracic region: Secondary | ICD-10-CM | POA: Diagnosis not present

## 2016-08-14 DIAGNOSIS — I10 Essential (primary) hypertension: Secondary | ICD-10-CM | POA: Diagnosis not present

## 2016-08-14 DIAGNOSIS — G4730 Sleep apnea, unspecified: Secondary | ICD-10-CM | POA: Diagnosis not present

## 2016-08-14 DIAGNOSIS — I2510 Atherosclerotic heart disease of native coronary artery without angina pectoris: Secondary | ICD-10-CM | POA: Diagnosis not present

## 2016-08-14 DIAGNOSIS — E119 Type 2 diabetes mellitus without complications: Secondary | ICD-10-CM | POA: Diagnosis not present

## 2016-08-19 DIAGNOSIS — M9901 Segmental and somatic dysfunction of cervical region: Secondary | ICD-10-CM | POA: Diagnosis not present

## 2016-08-19 DIAGNOSIS — M5033 Other cervical disc degeneration, cervicothoracic region: Secondary | ICD-10-CM | POA: Diagnosis not present

## 2016-08-19 DIAGNOSIS — M9903 Segmental and somatic dysfunction of lumbar region: Secondary | ICD-10-CM | POA: Diagnosis not present

## 2016-08-19 DIAGNOSIS — S0502XA Injury of conjunctiva and corneal abrasion without foreign body, left eye, initial encounter: Secondary | ICD-10-CM | POA: Diagnosis not present

## 2016-08-19 DIAGNOSIS — M9902 Segmental and somatic dysfunction of thoracic region: Secondary | ICD-10-CM | POA: Diagnosis not present

## 2016-08-20 DIAGNOSIS — M9903 Segmental and somatic dysfunction of lumbar region: Secondary | ICD-10-CM | POA: Diagnosis not present

## 2016-08-20 DIAGNOSIS — M9902 Segmental and somatic dysfunction of thoracic region: Secondary | ICD-10-CM | POA: Diagnosis not present

## 2016-08-20 DIAGNOSIS — M9901 Segmental and somatic dysfunction of cervical region: Secondary | ICD-10-CM | POA: Diagnosis not present

## 2016-08-20 DIAGNOSIS — M5033 Other cervical disc degeneration, cervicothoracic region: Secondary | ICD-10-CM | POA: Diagnosis not present

## 2016-08-22 DIAGNOSIS — E119 Type 2 diabetes mellitus without complications: Secondary | ICD-10-CM | POA: Diagnosis not present

## 2016-08-22 DIAGNOSIS — N12 Tubulo-interstitial nephritis, not specified as acute or chronic: Secondary | ICD-10-CM | POA: Diagnosis not present

## 2016-08-22 DIAGNOSIS — N10 Acute tubulo-interstitial nephritis: Secondary | ICD-10-CM | POA: Diagnosis not present

## 2016-08-22 DIAGNOSIS — I10 Essential (primary) hypertension: Secondary | ICD-10-CM | POA: Diagnosis not present

## 2016-08-22 DIAGNOSIS — N200 Calculus of kidney: Secondary | ICD-10-CM | POA: Diagnosis not present

## 2016-08-22 DIAGNOSIS — R102 Pelvic and perineal pain: Secondary | ICD-10-CM | POA: Diagnosis not present

## 2016-08-27 DIAGNOSIS — M9902 Segmental and somatic dysfunction of thoracic region: Secondary | ICD-10-CM | POA: Diagnosis not present

## 2016-08-27 DIAGNOSIS — M9901 Segmental and somatic dysfunction of cervical region: Secondary | ICD-10-CM | POA: Diagnosis not present

## 2016-08-27 DIAGNOSIS — M9903 Segmental and somatic dysfunction of lumbar region: Secondary | ICD-10-CM | POA: Diagnosis not present

## 2016-08-27 DIAGNOSIS — M5033 Other cervical disc degeneration, cervicothoracic region: Secondary | ICD-10-CM | POA: Diagnosis not present

## 2016-09-01 DIAGNOSIS — M5033 Other cervical disc degeneration, cervicothoracic region: Secondary | ICD-10-CM | POA: Diagnosis not present

## 2016-09-01 DIAGNOSIS — M9902 Segmental and somatic dysfunction of thoracic region: Secondary | ICD-10-CM | POA: Diagnosis not present

## 2016-09-01 DIAGNOSIS — M9901 Segmental and somatic dysfunction of cervical region: Secondary | ICD-10-CM | POA: Diagnosis not present

## 2016-09-01 DIAGNOSIS — M9903 Segmental and somatic dysfunction of lumbar region: Secondary | ICD-10-CM | POA: Diagnosis not present

## 2016-09-03 DIAGNOSIS — M5033 Other cervical disc degeneration, cervicothoracic region: Secondary | ICD-10-CM | POA: Diagnosis not present

## 2016-09-03 DIAGNOSIS — M9901 Segmental and somatic dysfunction of cervical region: Secondary | ICD-10-CM | POA: Diagnosis not present

## 2016-09-03 DIAGNOSIS — M9903 Segmental and somatic dysfunction of lumbar region: Secondary | ICD-10-CM | POA: Diagnosis not present

## 2016-09-03 DIAGNOSIS — M9902 Segmental and somatic dysfunction of thoracic region: Secondary | ICD-10-CM | POA: Diagnosis not present

## 2016-09-08 DIAGNOSIS — M5033 Other cervical disc degeneration, cervicothoracic region: Secondary | ICD-10-CM | POA: Diagnosis not present

## 2016-09-08 DIAGNOSIS — M9903 Segmental and somatic dysfunction of lumbar region: Secondary | ICD-10-CM | POA: Diagnosis not present

## 2016-09-08 DIAGNOSIS — M9902 Segmental and somatic dysfunction of thoracic region: Secondary | ICD-10-CM | POA: Diagnosis not present

## 2016-09-08 DIAGNOSIS — M9901 Segmental and somatic dysfunction of cervical region: Secondary | ICD-10-CM | POA: Diagnosis not present

## 2016-09-10 DIAGNOSIS — M5033 Other cervical disc degeneration, cervicothoracic region: Secondary | ICD-10-CM | POA: Diagnosis not present

## 2016-09-10 DIAGNOSIS — M9902 Segmental and somatic dysfunction of thoracic region: Secondary | ICD-10-CM | POA: Diagnosis not present

## 2016-09-10 DIAGNOSIS — M9903 Segmental and somatic dysfunction of lumbar region: Secondary | ICD-10-CM | POA: Diagnosis not present

## 2016-09-10 DIAGNOSIS — M9901 Segmental and somatic dysfunction of cervical region: Secondary | ICD-10-CM | POA: Diagnosis not present

## 2016-09-16 DIAGNOSIS — M9902 Segmental and somatic dysfunction of thoracic region: Secondary | ICD-10-CM | POA: Diagnosis not present

## 2016-09-16 DIAGNOSIS — M9901 Segmental and somatic dysfunction of cervical region: Secondary | ICD-10-CM | POA: Diagnosis not present

## 2016-09-16 DIAGNOSIS — M5033 Other cervical disc degeneration, cervicothoracic region: Secondary | ICD-10-CM | POA: Diagnosis not present

## 2016-09-16 DIAGNOSIS — M9903 Segmental and somatic dysfunction of lumbar region: Secondary | ICD-10-CM | POA: Diagnosis not present

## 2016-09-17 DIAGNOSIS — M9902 Segmental and somatic dysfunction of thoracic region: Secondary | ICD-10-CM | POA: Diagnosis not present

## 2016-09-17 DIAGNOSIS — M9901 Segmental and somatic dysfunction of cervical region: Secondary | ICD-10-CM | POA: Diagnosis not present

## 2016-09-17 DIAGNOSIS — M9903 Segmental and somatic dysfunction of lumbar region: Secondary | ICD-10-CM | POA: Diagnosis not present

## 2016-09-17 DIAGNOSIS — M5033 Other cervical disc degeneration, cervicothoracic region: Secondary | ICD-10-CM | POA: Diagnosis not present

## 2016-09-22 DIAGNOSIS — M9901 Segmental and somatic dysfunction of cervical region: Secondary | ICD-10-CM | POA: Diagnosis not present

## 2016-09-22 DIAGNOSIS — M9902 Segmental and somatic dysfunction of thoracic region: Secondary | ICD-10-CM | POA: Diagnosis not present

## 2016-09-22 DIAGNOSIS — M5033 Other cervical disc degeneration, cervicothoracic region: Secondary | ICD-10-CM | POA: Diagnosis not present

## 2016-09-22 DIAGNOSIS — M9903 Segmental and somatic dysfunction of lumbar region: Secondary | ICD-10-CM | POA: Diagnosis not present

## 2016-09-24 DIAGNOSIS — I2510 Atherosclerotic heart disease of native coronary artery without angina pectoris: Secondary | ICD-10-CM | POA: Diagnosis not present

## 2016-09-24 DIAGNOSIS — Z6836 Body mass index (BMI) 36.0-36.9, adult: Secondary | ICD-10-CM | POA: Diagnosis not present

## 2016-09-24 DIAGNOSIS — I10 Essential (primary) hypertension: Secondary | ICD-10-CM | POA: Diagnosis not present

## 2016-09-24 DIAGNOSIS — M9902 Segmental and somatic dysfunction of thoracic region: Secondary | ICD-10-CM | POA: Diagnosis not present

## 2016-09-24 DIAGNOSIS — M9901 Segmental and somatic dysfunction of cervical region: Secondary | ICD-10-CM | POA: Diagnosis not present

## 2016-09-24 DIAGNOSIS — M5033 Other cervical disc degeneration, cervicothoracic region: Secondary | ICD-10-CM | POA: Diagnosis not present

## 2016-09-24 DIAGNOSIS — M9903 Segmental and somatic dysfunction of lumbar region: Secondary | ICD-10-CM | POA: Diagnosis not present

## 2016-09-24 DIAGNOSIS — Z299 Encounter for prophylactic measures, unspecified: Secondary | ICD-10-CM | POA: Diagnosis not present

## 2016-09-24 DIAGNOSIS — N182 Chronic kidney disease, stage 2 (mild): Secondary | ICD-10-CM | POA: Diagnosis not present

## 2016-09-24 DIAGNOSIS — M545 Low back pain: Secondary | ICD-10-CM | POA: Diagnosis not present

## 2016-09-24 DIAGNOSIS — M549 Dorsalgia, unspecified: Secondary | ICD-10-CM | POA: Diagnosis not present

## 2016-09-28 DIAGNOSIS — M9902 Segmental and somatic dysfunction of thoracic region: Secondary | ICD-10-CM | POA: Diagnosis not present

## 2016-09-28 DIAGNOSIS — M9901 Segmental and somatic dysfunction of cervical region: Secondary | ICD-10-CM | POA: Diagnosis not present

## 2016-09-28 DIAGNOSIS — M9903 Segmental and somatic dysfunction of lumbar region: Secondary | ICD-10-CM | POA: Diagnosis not present

## 2016-09-28 DIAGNOSIS — M5033 Other cervical disc degeneration, cervicothoracic region: Secondary | ICD-10-CM | POA: Diagnosis not present

## 2016-10-02 DIAGNOSIS — L03115 Cellulitis of right lower limb: Secondary | ICD-10-CM | POA: Diagnosis not present

## 2016-10-02 DIAGNOSIS — W57XXXA Bitten or stung by nonvenomous insect and other nonvenomous arthropods, initial encounter: Secondary | ICD-10-CM | POA: Diagnosis not present

## 2016-10-05 DIAGNOSIS — Z800 Family history of malignant neoplasm of digestive organs: Secondary | ICD-10-CM | POA: Diagnosis not present

## 2016-10-05 DIAGNOSIS — K219 Gastro-esophageal reflux disease without esophagitis: Secondary | ICD-10-CM | POA: Diagnosis not present

## 2016-10-05 DIAGNOSIS — Z1211 Encounter for screening for malignant neoplasm of colon: Secondary | ICD-10-CM | POA: Diagnosis not present

## 2016-10-05 DIAGNOSIS — K5909 Other constipation: Secondary | ICD-10-CM | POA: Diagnosis not present

## 2016-10-07 DIAGNOSIS — M9901 Segmental and somatic dysfunction of cervical region: Secondary | ICD-10-CM | POA: Diagnosis not present

## 2016-10-07 DIAGNOSIS — M9903 Segmental and somatic dysfunction of lumbar region: Secondary | ICD-10-CM | POA: Diagnosis not present

## 2016-10-07 DIAGNOSIS — M545 Low back pain: Secondary | ICD-10-CM | POA: Diagnosis not present

## 2016-10-07 DIAGNOSIS — M6281 Muscle weakness (generalized): Secondary | ICD-10-CM | POA: Diagnosis not present

## 2016-10-07 DIAGNOSIS — M5033 Other cervical disc degeneration, cervicothoracic region: Secondary | ICD-10-CM | POA: Diagnosis not present

## 2016-10-07 DIAGNOSIS — M9902 Segmental and somatic dysfunction of thoracic region: Secondary | ICD-10-CM | POA: Diagnosis not present

## 2016-10-07 DIAGNOSIS — R293 Abnormal posture: Secondary | ICD-10-CM | POA: Diagnosis not present

## 2016-10-09 DIAGNOSIS — R293 Abnormal posture: Secondary | ICD-10-CM | POA: Diagnosis not present

## 2016-10-09 DIAGNOSIS — M545 Low back pain: Secondary | ICD-10-CM | POA: Diagnosis not present

## 2016-10-09 DIAGNOSIS — M6281 Muscle weakness (generalized): Secondary | ICD-10-CM | POA: Diagnosis not present

## 2016-10-13 DIAGNOSIS — M6281 Muscle weakness (generalized): Secondary | ICD-10-CM | POA: Diagnosis not present

## 2016-10-13 DIAGNOSIS — M9901 Segmental and somatic dysfunction of cervical region: Secondary | ICD-10-CM | POA: Diagnosis not present

## 2016-10-13 DIAGNOSIS — M545 Low back pain: Secondary | ICD-10-CM | POA: Diagnosis not present

## 2016-10-13 DIAGNOSIS — R293 Abnormal posture: Secondary | ICD-10-CM | POA: Diagnosis not present

## 2016-10-13 DIAGNOSIS — M9903 Segmental and somatic dysfunction of lumbar region: Secondary | ICD-10-CM | POA: Diagnosis not present

## 2016-10-13 DIAGNOSIS — M9902 Segmental and somatic dysfunction of thoracic region: Secondary | ICD-10-CM | POA: Diagnosis not present

## 2016-10-13 DIAGNOSIS — M5033 Other cervical disc degeneration, cervicothoracic region: Secondary | ICD-10-CM | POA: Diagnosis not present

## 2016-10-16 DIAGNOSIS — M6281 Muscle weakness (generalized): Secondary | ICD-10-CM | POA: Diagnosis not present

## 2016-10-16 DIAGNOSIS — M545 Low back pain: Secondary | ICD-10-CM | POA: Diagnosis not present

## 2016-10-16 DIAGNOSIS — R293 Abnormal posture: Secondary | ICD-10-CM | POA: Diagnosis not present

## 2016-10-20 DIAGNOSIS — M6281 Muscle weakness (generalized): Secondary | ICD-10-CM | POA: Diagnosis not present

## 2016-10-20 DIAGNOSIS — M545 Low back pain: Secondary | ICD-10-CM | POA: Diagnosis not present

## 2016-10-20 DIAGNOSIS — R293 Abnormal posture: Secondary | ICD-10-CM | POA: Diagnosis not present

## 2016-10-22 DIAGNOSIS — G4733 Obstructive sleep apnea (adult) (pediatric): Secondary | ICD-10-CM | POA: Diagnosis not present

## 2016-10-22 DIAGNOSIS — E7800 Pure hypercholesterolemia, unspecified: Secondary | ICD-10-CM | POA: Diagnosis not present

## 2016-10-22 DIAGNOSIS — M549 Dorsalgia, unspecified: Secondary | ICD-10-CM | POA: Diagnosis not present

## 2016-10-22 DIAGNOSIS — E1165 Type 2 diabetes mellitus with hyperglycemia: Secondary | ICD-10-CM | POA: Diagnosis not present

## 2016-10-22 DIAGNOSIS — I2510 Atherosclerotic heart disease of native coronary artery without angina pectoris: Secondary | ICD-10-CM | POA: Diagnosis not present

## 2016-10-22 DIAGNOSIS — Z299 Encounter for prophylactic measures, unspecified: Secondary | ICD-10-CM | POA: Diagnosis not present

## 2016-10-22 DIAGNOSIS — Z6836 Body mass index (BMI) 36.0-36.9, adult: Secondary | ICD-10-CM | POA: Diagnosis not present

## 2016-10-22 DIAGNOSIS — I10 Essential (primary) hypertension: Secondary | ICD-10-CM | POA: Diagnosis not present

## 2016-10-22 DIAGNOSIS — E349 Endocrine disorder, unspecified: Secondary | ICD-10-CM | POA: Diagnosis not present

## 2016-10-23 DIAGNOSIS — R293 Abnormal posture: Secondary | ICD-10-CM | POA: Diagnosis not present

## 2016-10-23 DIAGNOSIS — M6281 Muscle weakness (generalized): Secondary | ICD-10-CM | POA: Diagnosis not present

## 2016-10-23 DIAGNOSIS — M545 Low back pain: Secondary | ICD-10-CM | POA: Diagnosis not present

## 2016-10-27 DIAGNOSIS — M5033 Other cervical disc degeneration, cervicothoracic region: Secondary | ICD-10-CM | POA: Diagnosis not present

## 2016-10-27 DIAGNOSIS — M9902 Segmental and somatic dysfunction of thoracic region: Secondary | ICD-10-CM | POA: Diagnosis not present

## 2016-10-27 DIAGNOSIS — M9903 Segmental and somatic dysfunction of lumbar region: Secondary | ICD-10-CM | POA: Diagnosis not present

## 2016-10-27 DIAGNOSIS — M6281 Muscle weakness (generalized): Secondary | ICD-10-CM | POA: Diagnosis not present

## 2016-10-27 DIAGNOSIS — R293 Abnormal posture: Secondary | ICD-10-CM | POA: Diagnosis not present

## 2016-10-27 DIAGNOSIS — M9901 Segmental and somatic dysfunction of cervical region: Secondary | ICD-10-CM | POA: Diagnosis not present

## 2016-10-27 DIAGNOSIS — M545 Low back pain: Secondary | ICD-10-CM | POA: Diagnosis not present

## 2016-10-29 DIAGNOSIS — M9983 Other biomechanical lesions of lumbar region: Secondary | ICD-10-CM | POA: Diagnosis not present

## 2016-10-29 DIAGNOSIS — R293 Abnormal posture: Secondary | ICD-10-CM | POA: Diagnosis not present

## 2016-10-29 DIAGNOSIS — M47816 Spondylosis without myelopathy or radiculopathy, lumbar region: Secondary | ICD-10-CM | POA: Diagnosis not present

## 2016-10-29 DIAGNOSIS — M545 Low back pain: Secondary | ICD-10-CM | POA: Diagnosis not present

## 2016-10-29 DIAGNOSIS — M48061 Spinal stenosis, lumbar region without neurogenic claudication: Secondary | ICD-10-CM | POA: Diagnosis not present

## 2016-10-29 DIAGNOSIS — M6281 Muscle weakness (generalized): Secondary | ICD-10-CM | POA: Diagnosis not present

## 2016-10-29 DIAGNOSIS — M47817 Spondylosis without myelopathy or radiculopathy, lumbosacral region: Secondary | ICD-10-CM | POA: Diagnosis not present

## 2016-11-02 DIAGNOSIS — M6281 Muscle weakness (generalized): Secondary | ICD-10-CM | POA: Diagnosis not present

## 2016-11-02 DIAGNOSIS — M545 Low back pain: Secondary | ICD-10-CM | POA: Diagnosis not present

## 2016-11-02 DIAGNOSIS — R293 Abnormal posture: Secondary | ICD-10-CM | POA: Diagnosis not present

## 2016-11-10 DIAGNOSIS — Z299 Encounter for prophylactic measures, unspecified: Secondary | ICD-10-CM | POA: Diagnosis not present

## 2016-11-10 DIAGNOSIS — R6889 Other general symptoms and signs: Secondary | ICD-10-CM | POA: Diagnosis not present

## 2016-11-10 DIAGNOSIS — M549 Dorsalgia, unspecified: Secondary | ICD-10-CM | POA: Diagnosis not present

## 2016-11-10 DIAGNOSIS — I2510 Atherosclerotic heart disease of native coronary artery without angina pectoris: Secondary | ICD-10-CM | POA: Diagnosis not present

## 2016-11-10 DIAGNOSIS — E7800 Pure hypercholesterolemia, unspecified: Secondary | ICD-10-CM | POA: Diagnosis not present

## 2016-11-10 DIAGNOSIS — Z6836 Body mass index (BMI) 36.0-36.9, adult: Secondary | ICD-10-CM | POA: Diagnosis not present

## 2016-11-10 DIAGNOSIS — M4800 Spinal stenosis, site unspecified: Secondary | ICD-10-CM | POA: Diagnosis not present

## 2016-11-10 DIAGNOSIS — Z713 Dietary counseling and surveillance: Secondary | ICD-10-CM | POA: Diagnosis not present

## 2016-11-10 DIAGNOSIS — I10 Essential (primary) hypertension: Secondary | ICD-10-CM | POA: Diagnosis not present

## 2016-11-10 DIAGNOSIS — E1165 Type 2 diabetes mellitus with hyperglycemia: Secondary | ICD-10-CM | POA: Diagnosis not present

## 2016-11-11 DIAGNOSIS — M6281 Muscle weakness (generalized): Secondary | ICD-10-CM | POA: Diagnosis not present

## 2016-11-11 DIAGNOSIS — M545 Low back pain: Secondary | ICD-10-CM | POA: Diagnosis not present

## 2016-11-11 DIAGNOSIS — R293 Abnormal posture: Secondary | ICD-10-CM | POA: Diagnosis not present

## 2016-12-08 DIAGNOSIS — R6889 Other general symptoms and signs: Secondary | ICD-10-CM | POA: Diagnosis not present

## 2016-12-14 DIAGNOSIS — R160 Hepatomegaly, not elsewhere classified: Secondary | ICD-10-CM | POA: Diagnosis not present

## 2016-12-14 DIAGNOSIS — R1033 Periumbilical pain: Secondary | ICD-10-CM | POA: Diagnosis not present

## 2016-12-14 DIAGNOSIS — N390 Urinary tract infection, site not specified: Secondary | ICD-10-CM | POA: Diagnosis not present

## 2016-12-14 DIAGNOSIS — E119 Type 2 diabetes mellitus without complications: Secondary | ICD-10-CM | POA: Diagnosis not present

## 2016-12-14 DIAGNOSIS — N132 Hydronephrosis with renal and ureteral calculous obstruction: Secondary | ICD-10-CM | POA: Diagnosis not present

## 2016-12-14 DIAGNOSIS — N200 Calculus of kidney: Secondary | ICD-10-CM | POA: Diagnosis not present

## 2016-12-14 DIAGNOSIS — I252 Old myocardial infarction: Secondary | ICD-10-CM | POA: Diagnosis not present

## 2016-12-14 DIAGNOSIS — I2510 Atherosclerotic heart disease of native coronary artery without angina pectoris: Secondary | ICD-10-CM | POA: Diagnosis not present

## 2016-12-14 DIAGNOSIS — N10 Acute tubulo-interstitial nephritis: Secondary | ICD-10-CM | POA: Diagnosis not present

## 2016-12-14 DIAGNOSIS — Z7902 Long term (current) use of antithrombotics/antiplatelets: Secondary | ICD-10-CM | POA: Diagnosis not present

## 2016-12-14 DIAGNOSIS — Z79899 Other long term (current) drug therapy: Secondary | ICD-10-CM | POA: Diagnosis not present

## 2016-12-14 DIAGNOSIS — Z7984 Long term (current) use of oral hypoglycemic drugs: Secondary | ICD-10-CM | POA: Diagnosis not present

## 2016-12-14 DIAGNOSIS — I10 Essential (primary) hypertension: Secondary | ICD-10-CM | POA: Diagnosis not present

## 2016-12-14 DIAGNOSIS — Z794 Long term (current) use of insulin: Secondary | ICD-10-CM | POA: Diagnosis not present

## 2016-12-15 DIAGNOSIS — N10 Acute tubulo-interstitial nephritis: Secondary | ICD-10-CM | POA: Diagnosis not present

## 2016-12-15 DIAGNOSIS — N1339 Other hydronephrosis: Secondary | ICD-10-CM | POA: Diagnosis not present

## 2016-12-15 DIAGNOSIS — N200 Calculus of kidney: Secondary | ICD-10-CM | POA: Diagnosis not present

## 2016-12-16 DIAGNOSIS — N10 Acute tubulo-interstitial nephritis: Secondary | ICD-10-CM | POA: Diagnosis not present

## 2016-12-16 DIAGNOSIS — N200 Calculus of kidney: Secondary | ICD-10-CM | POA: Diagnosis not present

## 2016-12-16 DIAGNOSIS — N1339 Other hydronephrosis: Secondary | ICD-10-CM | POA: Diagnosis not present

## 2016-12-17 DIAGNOSIS — M48062 Spinal stenosis, lumbar region with neurogenic claudication: Secondary | ICD-10-CM | POA: Diagnosis not present

## 2016-12-17 DIAGNOSIS — M5416 Radiculopathy, lumbar region: Secondary | ICD-10-CM | POA: Diagnosis not present

## 2016-12-17 DIAGNOSIS — M1288 Other specific arthropathies, not elsewhere classified, other specified site: Secondary | ICD-10-CM | POA: Diagnosis not present

## 2017-01-01 DIAGNOSIS — N200 Calculus of kidney: Secondary | ICD-10-CM | POA: Diagnosis not present

## 2017-01-01 DIAGNOSIS — R350 Frequency of micturition: Secondary | ICD-10-CM | POA: Diagnosis not present

## 2017-01-01 DIAGNOSIS — N401 Enlarged prostate with lower urinary tract symptoms: Secondary | ICD-10-CM | POA: Diagnosis not present

## 2017-01-01 DIAGNOSIS — R3915 Urgency of urination: Secondary | ICD-10-CM | POA: Diagnosis not present

## 2017-01-01 DIAGNOSIS — R351 Nocturia: Secondary | ICD-10-CM | POA: Diagnosis not present

## 2017-01-06 DIAGNOSIS — R6889 Other general symptoms and signs: Secondary | ICD-10-CM | POA: Diagnosis not present

## 2017-01-13 DIAGNOSIS — I10 Essential (primary) hypertension: Secondary | ICD-10-CM | POA: Diagnosis not present

## 2017-01-13 DIAGNOSIS — M545 Low back pain: Secondary | ICD-10-CM | POA: Diagnosis not present

## 2017-01-13 DIAGNOSIS — G4739 Other sleep apnea: Secondary | ICD-10-CM | POA: Diagnosis not present

## 2017-01-13 DIAGNOSIS — G8929 Other chronic pain: Secondary | ICD-10-CM | POA: Diagnosis not present

## 2017-01-13 DIAGNOSIS — M48062 Spinal stenosis, lumbar region with neurogenic claudication: Secondary | ICD-10-CM | POA: Diagnosis not present

## 2017-01-13 DIAGNOSIS — I2510 Atherosclerotic heart disease of native coronary artery without angina pectoris: Secondary | ICD-10-CM | POA: Diagnosis not present

## 2017-01-13 DIAGNOSIS — Z79899 Other long term (current) drug therapy: Secondary | ICD-10-CM | POA: Diagnosis not present

## 2017-01-13 DIAGNOSIS — M5416 Radiculopathy, lumbar region: Secondary | ICD-10-CM | POA: Diagnosis not present

## 2017-01-20 DIAGNOSIS — K621 Rectal polyp: Secondary | ICD-10-CM | POA: Diagnosis not present

## 2017-01-20 DIAGNOSIS — K2210 Ulcer of esophagus without bleeding: Secondary | ICD-10-CM | POA: Diagnosis not present

## 2017-01-20 DIAGNOSIS — K648 Other hemorrhoids: Secondary | ICD-10-CM | POA: Diagnosis not present

## 2017-01-20 DIAGNOSIS — K209 Esophagitis, unspecified: Secondary | ICD-10-CM | POA: Diagnosis not present

## 2017-01-20 DIAGNOSIS — K449 Diaphragmatic hernia without obstruction or gangrene: Secondary | ICD-10-CM | POA: Diagnosis not present

## 2017-01-20 DIAGNOSIS — Z800 Family history of malignant neoplasm of digestive organs: Secondary | ICD-10-CM | POA: Diagnosis not present

## 2017-01-20 DIAGNOSIS — K2980 Duodenitis without bleeding: Secondary | ICD-10-CM | POA: Diagnosis not present

## 2017-01-20 DIAGNOSIS — Z86010 Personal history of colonic polyps: Secondary | ICD-10-CM | POA: Diagnosis not present

## 2017-01-20 DIAGNOSIS — Z1211 Encounter for screening for malignant neoplasm of colon: Secondary | ICD-10-CM | POA: Diagnosis not present

## 2017-01-20 DIAGNOSIS — K219 Gastro-esophageal reflux disease without esophagitis: Secondary | ICD-10-CM | POA: Diagnosis not present

## 2017-02-08 DIAGNOSIS — E1140 Type 2 diabetes mellitus with diabetic neuropathy, unspecified: Secondary | ICD-10-CM | POA: Diagnosis not present

## 2017-02-08 DIAGNOSIS — E1151 Type 2 diabetes mellitus with diabetic peripheral angiopathy without gangrene: Secondary | ICD-10-CM | POA: Diagnosis not present

## 2017-02-11 DIAGNOSIS — R6889 Other general symptoms and signs: Secondary | ICD-10-CM | POA: Diagnosis not present

## 2017-02-12 DIAGNOSIS — T1512XA Foreign body in conjunctival sac, left eye, initial encounter: Secondary | ICD-10-CM | POA: Diagnosis not present

## 2017-02-12 DIAGNOSIS — S0502XA Injury of conjunctiva and corneal abrasion without foreign body, left eye, initial encounter: Secondary | ICD-10-CM | POA: Diagnosis not present

## 2017-02-19 DIAGNOSIS — K5909 Other constipation: Secondary | ICD-10-CM | POA: Diagnosis not present

## 2017-02-19 DIAGNOSIS — K2210 Ulcer of esophagus without bleeding: Secondary | ICD-10-CM | POA: Diagnosis not present

## 2017-02-19 DIAGNOSIS — K219 Gastro-esophageal reflux disease without esophagitis: Secondary | ICD-10-CM | POA: Diagnosis not present

## 2017-02-19 DIAGNOSIS — R160 Hepatomegaly, not elsewhere classified: Secondary | ICD-10-CM | POA: Diagnosis not present

## 2017-03-04 DIAGNOSIS — R160 Hepatomegaly, not elsewhere classified: Secondary | ICD-10-CM | POA: Diagnosis not present

## 2017-04-01 DIAGNOSIS — I10 Essential (primary) hypertension: Secondary | ICD-10-CM | POA: Diagnosis not present

## 2017-04-01 DIAGNOSIS — I2510 Atherosclerotic heart disease of native coronary artery without angina pectoris: Secondary | ICD-10-CM | POA: Diagnosis not present

## 2017-04-01 DIAGNOSIS — G4730 Sleep apnea, unspecified: Secondary | ICD-10-CM | POA: Diagnosis not present

## 2017-04-01 DIAGNOSIS — R6889 Other general symptoms and signs: Secondary | ICD-10-CM | POA: Diagnosis not present

## 2017-04-01 DIAGNOSIS — E119 Type 2 diabetes mellitus without complications: Secondary | ICD-10-CM | POA: Diagnosis not present

## 2017-04-08 DIAGNOSIS — M5416 Radiculopathy, lumbar region: Secondary | ICD-10-CM | POA: Diagnosis not present

## 2017-04-08 DIAGNOSIS — M5410 Radiculopathy, site unspecified: Secondary | ICD-10-CM | POA: Diagnosis not present

## 2017-04-08 DIAGNOSIS — M545 Low back pain: Secondary | ICD-10-CM | POA: Diagnosis not present

## 2017-04-30 DIAGNOSIS — Z7189 Other specified counseling: Secondary | ICD-10-CM | POA: Diagnosis not present

## 2017-04-30 DIAGNOSIS — G4733 Obstructive sleep apnea (adult) (pediatric): Secondary | ICD-10-CM | POA: Diagnosis not present

## 2017-04-30 DIAGNOSIS — Z79899 Other long term (current) drug therapy: Secondary | ICD-10-CM | POA: Diagnosis not present

## 2017-04-30 DIAGNOSIS — Z1331 Encounter for screening for depression: Secondary | ICD-10-CM | POA: Diagnosis not present

## 2017-04-30 DIAGNOSIS — Z0000 Encounter for general adult medical examination without abnormal findings: Secondary | ICD-10-CM | POA: Diagnosis not present

## 2017-04-30 DIAGNOSIS — Z125 Encounter for screening for malignant neoplasm of prostate: Secondary | ICD-10-CM | POA: Diagnosis not present

## 2017-04-30 DIAGNOSIS — Z1339 Encounter for screening examination for other mental health and behavioral disorders: Secondary | ICD-10-CM | POA: Diagnosis not present

## 2017-04-30 DIAGNOSIS — Z6837 Body mass index (BMI) 37.0-37.9, adult: Secondary | ICD-10-CM | POA: Diagnosis not present

## 2017-04-30 DIAGNOSIS — I2510 Atherosclerotic heart disease of native coronary artery without angina pectoris: Secondary | ICD-10-CM | POA: Diagnosis not present

## 2017-04-30 DIAGNOSIS — R5383 Other fatigue: Secondary | ICD-10-CM | POA: Diagnosis not present

## 2017-04-30 DIAGNOSIS — E1165 Type 2 diabetes mellitus with hyperglycemia: Secondary | ICD-10-CM | POA: Diagnosis not present

## 2017-04-30 DIAGNOSIS — Z299 Encounter for prophylactic measures, unspecified: Secondary | ICD-10-CM | POA: Diagnosis not present

## 2017-04-30 DIAGNOSIS — E7800 Pure hypercholesterolemia, unspecified: Secondary | ICD-10-CM | POA: Diagnosis not present

## 2017-04-30 DIAGNOSIS — Z1211 Encounter for screening for malignant neoplasm of colon: Secondary | ICD-10-CM | POA: Diagnosis not present

## 2017-04-30 DIAGNOSIS — I10 Essential (primary) hypertension: Secondary | ICD-10-CM | POA: Diagnosis not present

## 2017-05-04 DIAGNOSIS — R6889 Other general symptoms and signs: Secondary | ICD-10-CM | POA: Diagnosis not present

## 2017-05-24 DIAGNOSIS — E1151 Type 2 diabetes mellitus with diabetic peripheral angiopathy without gangrene: Secondary | ICD-10-CM | POA: Diagnosis not present

## 2017-05-24 DIAGNOSIS — E1140 Type 2 diabetes mellitus with diabetic neuropathy, unspecified: Secondary | ICD-10-CM | POA: Diagnosis not present

## 2017-06-01 DIAGNOSIS — R6889 Other general symptoms and signs: Secondary | ICD-10-CM | POA: Diagnosis not present

## 2017-06-21 DIAGNOSIS — M25531 Pain in right wrist: Secondary | ICD-10-CM | POA: Diagnosis not present

## 2017-06-22 DIAGNOSIS — R3915 Urgency of urination: Secondary | ICD-10-CM | POA: Diagnosis not present

## 2017-06-22 DIAGNOSIS — N200 Calculus of kidney: Secondary | ICD-10-CM | POA: Diagnosis not present

## 2017-06-22 DIAGNOSIS — R350 Frequency of micturition: Secondary | ICD-10-CM | POA: Diagnosis not present

## 2017-06-22 DIAGNOSIS — N401 Enlarged prostate with lower urinary tract symptoms: Secondary | ICD-10-CM | POA: Diagnosis not present

## 2017-06-22 DIAGNOSIS — R3913 Splitting of urinary stream: Secondary | ICD-10-CM | POA: Diagnosis not present

## 2017-06-24 DIAGNOSIS — H04123 Dry eye syndrome of bilateral lacrimal glands: Secondary | ICD-10-CM | POA: Diagnosis not present

## 2017-06-24 DIAGNOSIS — E119 Type 2 diabetes mellitus without complications: Secondary | ICD-10-CM | POA: Diagnosis not present

## 2017-06-24 DIAGNOSIS — H3540 Unspecified peripheral retinal degeneration: Secondary | ICD-10-CM | POA: Diagnosis not present

## 2017-06-24 DIAGNOSIS — H35371 Puckering of macula, right eye: Secondary | ICD-10-CM | POA: Diagnosis not present

## 2017-06-29 DIAGNOSIS — I2510 Atherosclerotic heart disease of native coronary artery without angina pectoris: Secondary | ICD-10-CM | POA: Diagnosis not present

## 2017-06-29 DIAGNOSIS — Z01818 Encounter for other preprocedural examination: Secondary | ICD-10-CM | POA: Diagnosis not present

## 2017-06-29 DIAGNOSIS — I10 Essential (primary) hypertension: Secondary | ICD-10-CM | POA: Diagnosis not present

## 2017-06-29 DIAGNOSIS — G4730 Sleep apnea, unspecified: Secondary | ICD-10-CM | POA: Diagnosis not present

## 2017-06-29 DIAGNOSIS — E119 Type 2 diabetes mellitus without complications: Secondary | ICD-10-CM | POA: Diagnosis not present

## 2017-07-02 DIAGNOSIS — Z87891 Personal history of nicotine dependence: Secondary | ICD-10-CM | POA: Diagnosis not present

## 2017-07-02 DIAGNOSIS — Z6838 Body mass index (BMI) 38.0-38.9, adult: Secondary | ICD-10-CM | POA: Diagnosis not present

## 2017-07-02 DIAGNOSIS — G4733 Obstructive sleep apnea (adult) (pediatric): Secondary | ICD-10-CM | POA: Diagnosis not present

## 2017-07-02 DIAGNOSIS — Z299 Encounter for prophylactic measures, unspecified: Secondary | ICD-10-CM | POA: Diagnosis not present

## 2017-07-02 DIAGNOSIS — E1165 Type 2 diabetes mellitus with hyperglycemia: Secondary | ICD-10-CM | POA: Diagnosis not present

## 2017-07-02 DIAGNOSIS — E7800 Pure hypercholesterolemia, unspecified: Secondary | ICD-10-CM | POA: Diagnosis not present

## 2017-07-02 DIAGNOSIS — I10 Essential (primary) hypertension: Secondary | ICD-10-CM | POA: Diagnosis not present

## 2017-07-16 DIAGNOSIS — Z6837 Body mass index (BMI) 37.0-37.9, adult: Secondary | ICD-10-CM | POA: Diagnosis not present

## 2017-07-16 DIAGNOSIS — Z713 Dietary counseling and surveillance: Secondary | ICD-10-CM | POA: Diagnosis not present

## 2017-07-16 DIAGNOSIS — Z299 Encounter for prophylactic measures, unspecified: Secondary | ICD-10-CM | POA: Diagnosis not present

## 2017-07-16 DIAGNOSIS — E1165 Type 2 diabetes mellitus with hyperglycemia: Secondary | ICD-10-CM | POA: Diagnosis not present

## 2017-07-20 DIAGNOSIS — R3912 Poor urinary stream: Secondary | ICD-10-CM | POA: Diagnosis not present

## 2017-07-20 DIAGNOSIS — Z87440 Personal history of urinary (tract) infections: Secondary | ICD-10-CM | POA: Diagnosis not present

## 2017-07-20 DIAGNOSIS — N401 Enlarged prostate with lower urinary tract symptoms: Secondary | ICD-10-CM | POA: Diagnosis not present

## 2017-07-20 DIAGNOSIS — N200 Calculus of kidney: Secondary | ICD-10-CM | POA: Diagnosis not present

## 2017-07-20 DIAGNOSIS — R3129 Other microscopic hematuria: Secondary | ICD-10-CM | POA: Diagnosis not present

## 2017-07-21 DIAGNOSIS — M4726 Other spondylosis with radiculopathy, lumbar region: Secondary | ICD-10-CM | POA: Diagnosis not present

## 2017-07-21 DIAGNOSIS — M48062 Spinal stenosis, lumbar region with neurogenic claudication: Secondary | ICD-10-CM | POA: Diagnosis not present

## 2017-07-21 DIAGNOSIS — M545 Low back pain: Secondary | ICD-10-CM | POA: Diagnosis not present

## 2017-07-21 DIAGNOSIS — M5410 Radiculopathy, site unspecified: Secondary | ICD-10-CM | POA: Diagnosis not present

## 2017-07-26 DIAGNOSIS — Z955 Presence of coronary angioplasty implant and graft: Secondary | ICD-10-CM | POA: Diagnosis not present

## 2017-07-26 DIAGNOSIS — E119 Type 2 diabetes mellitus without complications: Secondary | ICD-10-CM | POA: Diagnosis not present

## 2017-07-26 DIAGNOSIS — M47896 Other spondylosis, lumbar region: Secondary | ICD-10-CM | POA: Diagnosis not present

## 2017-07-26 DIAGNOSIS — N200 Calculus of kidney: Secondary | ICD-10-CM | POA: Diagnosis not present

## 2017-07-26 DIAGNOSIS — I2510 Atherosclerotic heart disease of native coronary artery without angina pectoris: Secondary | ICD-10-CM | POA: Diagnosis not present

## 2017-07-26 DIAGNOSIS — I252 Old myocardial infarction: Secondary | ICD-10-CM | POA: Diagnosis not present

## 2017-07-26 DIAGNOSIS — Z791 Long term (current) use of non-steroidal anti-inflammatories (NSAID): Secondary | ICD-10-CM | POA: Diagnosis not present

## 2017-07-28 DIAGNOSIS — I2510 Atherosclerotic heart disease of native coronary artery without angina pectoris: Secondary | ICD-10-CM | POA: Diagnosis not present

## 2017-07-28 DIAGNOSIS — N200 Calculus of kidney: Secondary | ICD-10-CM | POA: Diagnosis not present

## 2017-07-28 DIAGNOSIS — Z955 Presence of coronary angioplasty implant and graft: Secondary | ICD-10-CM | POA: Diagnosis not present

## 2017-07-28 DIAGNOSIS — I252 Old myocardial infarction: Secondary | ICD-10-CM | POA: Diagnosis not present

## 2017-07-28 DIAGNOSIS — M47896 Other spondylosis, lumbar region: Secondary | ICD-10-CM | POA: Diagnosis not present

## 2017-07-28 DIAGNOSIS — E119 Type 2 diabetes mellitus without complications: Secondary | ICD-10-CM | POA: Diagnosis not present

## 2017-07-30 DIAGNOSIS — S37019A Minor contusion of unspecified kidney, initial encounter: Secondary | ICD-10-CM | POA: Diagnosis not present

## 2017-07-30 DIAGNOSIS — R3129 Other microscopic hematuria: Secondary | ICD-10-CM | POA: Diagnosis not present

## 2017-07-30 DIAGNOSIS — R070 Pain in throat: Secondary | ICD-10-CM | POA: Diagnosis not present

## 2017-07-30 DIAGNOSIS — N200 Calculus of kidney: Secondary | ICD-10-CM | POA: Diagnosis not present

## 2017-08-05 DIAGNOSIS — N200 Calculus of kidney: Secondary | ICD-10-CM | POA: Diagnosis not present

## 2017-08-05 DIAGNOSIS — R3129 Other microscopic hematuria: Secondary | ICD-10-CM | POA: Diagnosis not present

## 2017-08-05 DIAGNOSIS — R350 Frequency of micturition: Secondary | ICD-10-CM | POA: Diagnosis not present

## 2017-08-09 DIAGNOSIS — E1140 Type 2 diabetes mellitus with diabetic neuropathy, unspecified: Secondary | ICD-10-CM | POA: Diagnosis not present

## 2017-08-09 DIAGNOSIS — E1151 Type 2 diabetes mellitus with diabetic peripheral angiopathy without gangrene: Secondary | ICD-10-CM | POA: Diagnosis not present

## 2017-08-11 DIAGNOSIS — Z6836 Body mass index (BMI) 36.0-36.9, adult: Secondary | ICD-10-CM | POA: Diagnosis not present

## 2017-08-11 DIAGNOSIS — E1165 Type 2 diabetes mellitus with hyperglycemia: Secondary | ICD-10-CM | POA: Diagnosis not present

## 2017-08-11 DIAGNOSIS — N200 Calculus of kidney: Secondary | ICD-10-CM | POA: Diagnosis not present

## 2017-08-11 DIAGNOSIS — K219 Gastro-esophageal reflux disease without esophagitis: Secondary | ICD-10-CM | POA: Diagnosis not present

## 2017-08-11 DIAGNOSIS — I10 Essential (primary) hypertension: Secondary | ICD-10-CM | POA: Diagnosis not present

## 2017-08-11 DIAGNOSIS — Z299 Encounter for prophylactic measures, unspecified: Secondary | ICD-10-CM | POA: Diagnosis not present

## 2017-08-17 DIAGNOSIS — N200 Calculus of kidney: Secondary | ICD-10-CM | POA: Diagnosis not present

## 2017-08-17 DIAGNOSIS — S37019A Minor contusion of unspecified kidney, initial encounter: Secondary | ICD-10-CM | POA: Diagnosis not present

## 2017-08-26 DIAGNOSIS — S37019A Minor contusion of unspecified kidney, initial encounter: Secondary | ICD-10-CM | POA: Diagnosis not present

## 2017-08-26 DIAGNOSIS — N200 Calculus of kidney: Secondary | ICD-10-CM | POA: Diagnosis not present

## 2017-09-08 DIAGNOSIS — S0502XA Injury of conjunctiva and corneal abrasion without foreign body, left eye, initial encounter: Secondary | ICD-10-CM | POA: Diagnosis not present

## 2017-09-28 DIAGNOSIS — M5410 Radiculopathy, site unspecified: Secondary | ICD-10-CM | POA: Diagnosis not present

## 2017-09-28 DIAGNOSIS — M545 Low back pain: Secondary | ICD-10-CM | POA: Diagnosis not present

## 2017-10-13 DIAGNOSIS — G4730 Sleep apnea, unspecified: Secondary | ICD-10-CM | POA: Diagnosis not present

## 2017-10-13 DIAGNOSIS — I2510 Atherosclerotic heart disease of native coronary artery without angina pectoris: Secondary | ICD-10-CM | POA: Diagnosis not present

## 2017-10-13 DIAGNOSIS — I10 Essential (primary) hypertension: Secondary | ICD-10-CM | POA: Diagnosis not present

## 2017-10-13 DIAGNOSIS — E119 Type 2 diabetes mellitus without complications: Secondary | ICD-10-CM | POA: Diagnosis not present

## 2017-10-25 DIAGNOSIS — E1140 Type 2 diabetes mellitus with diabetic neuropathy, unspecified: Secondary | ICD-10-CM | POA: Diagnosis not present

## 2017-10-25 DIAGNOSIS — E1151 Type 2 diabetes mellitus with diabetic peripheral angiopathy without gangrene: Secondary | ICD-10-CM | POA: Diagnosis not present

## 2017-10-31 DIAGNOSIS — I252 Old myocardial infarction: Secondary | ICD-10-CM | POA: Diagnosis not present

## 2017-10-31 DIAGNOSIS — Z9889 Other specified postprocedural states: Secondary | ICD-10-CM | POA: Diagnosis not present

## 2017-10-31 DIAGNOSIS — Z87891 Personal history of nicotine dependence: Secondary | ICD-10-CM | POA: Diagnosis not present

## 2017-10-31 DIAGNOSIS — E119 Type 2 diabetes mellitus without complications: Secondary | ICD-10-CM | POA: Diagnosis not present

## 2017-10-31 DIAGNOSIS — Z87442 Personal history of urinary calculi: Secondary | ICD-10-CM | POA: Diagnosis not present

## 2017-10-31 DIAGNOSIS — Z955 Presence of coronary angioplasty implant and graft: Secondary | ICD-10-CM | POA: Diagnosis not present

## 2017-10-31 DIAGNOSIS — I10 Essential (primary) hypertension: Secondary | ICD-10-CM | POA: Diagnosis not present

## 2017-10-31 DIAGNOSIS — F423 Hoarding disorder: Secondary | ICD-10-CM | POA: Diagnosis not present

## 2017-10-31 DIAGNOSIS — N390 Urinary tract infection, site not specified: Secondary | ICD-10-CM | POA: Diagnosis not present

## 2017-10-31 DIAGNOSIS — Z9049 Acquired absence of other specified parts of digestive tract: Secondary | ICD-10-CM | POA: Diagnosis not present

## 2017-11-05 DIAGNOSIS — Z955 Presence of coronary angioplasty implant and graft: Secondary | ICD-10-CM | POA: Diagnosis not present

## 2017-11-05 DIAGNOSIS — I252 Old myocardial infarction: Secondary | ICD-10-CM | POA: Diagnosis not present

## 2017-11-05 DIAGNOSIS — N390 Urinary tract infection, site not specified: Secondary | ICD-10-CM | POA: Diagnosis not present

## 2017-11-05 DIAGNOSIS — F423 Hoarding disorder: Secondary | ICD-10-CM | POA: Diagnosis not present

## 2017-11-05 DIAGNOSIS — E119 Type 2 diabetes mellitus without complications: Secondary | ICD-10-CM | POA: Diagnosis not present

## 2017-11-05 DIAGNOSIS — Z9889 Other specified postprocedural states: Secondary | ICD-10-CM | POA: Diagnosis not present

## 2017-11-05 DIAGNOSIS — Z9049 Acquired absence of other specified parts of digestive tract: Secondary | ICD-10-CM | POA: Diagnosis not present

## 2017-11-05 DIAGNOSIS — Z87442 Personal history of urinary calculi: Secondary | ICD-10-CM | POA: Diagnosis not present

## 2017-11-05 DIAGNOSIS — I10 Essential (primary) hypertension: Secondary | ICD-10-CM | POA: Diagnosis not present

## 2017-11-10 DIAGNOSIS — Z299 Encounter for prophylactic measures, unspecified: Secondary | ICD-10-CM | POA: Diagnosis not present

## 2017-11-10 DIAGNOSIS — I2510 Atherosclerotic heart disease of native coronary artery without angina pectoris: Secondary | ICD-10-CM | POA: Diagnosis not present

## 2017-11-10 DIAGNOSIS — E1165 Type 2 diabetes mellitus with hyperglycemia: Secondary | ICD-10-CM | POA: Diagnosis not present

## 2017-11-10 DIAGNOSIS — Z6836 Body mass index (BMI) 36.0-36.9, adult: Secondary | ICD-10-CM | POA: Diagnosis not present

## 2017-11-10 DIAGNOSIS — M79671 Pain in right foot: Secondary | ICD-10-CM | POA: Diagnosis not present

## 2017-11-10 DIAGNOSIS — I10 Essential (primary) hypertension: Secondary | ICD-10-CM | POA: Diagnosis not present

## 2017-11-10 DIAGNOSIS — G4733 Obstructive sleep apnea (adult) (pediatric): Secondary | ICD-10-CM | POA: Diagnosis not present

## 2017-11-11 DIAGNOSIS — R351 Nocturia: Secondary | ICD-10-CM | POA: Diagnosis not present

## 2017-11-11 DIAGNOSIS — Z87440 Personal history of urinary (tract) infections: Secondary | ICD-10-CM | POA: Diagnosis not present

## 2017-11-11 DIAGNOSIS — N200 Calculus of kidney: Secondary | ICD-10-CM | POA: Diagnosis not present

## 2017-11-11 DIAGNOSIS — N528 Other male erectile dysfunction: Secondary | ICD-10-CM | POA: Diagnosis not present

## 2017-11-13 DIAGNOSIS — R531 Weakness: Secondary | ICD-10-CM | POA: Diagnosis not present

## 2017-11-13 DIAGNOSIS — R4182 Altered mental status, unspecified: Secondary | ICD-10-CM | POA: Diagnosis not present

## 2017-11-13 DIAGNOSIS — Z79899 Other long term (current) drug therapy: Secondary | ICD-10-CM | POA: Diagnosis not present

## 2017-11-13 DIAGNOSIS — I10 Essential (primary) hypertension: Secondary | ICD-10-CM | POA: Diagnosis not present

## 2017-11-13 DIAGNOSIS — G9340 Encephalopathy, unspecified: Secondary | ICD-10-CM | POA: Diagnosis not present

## 2017-11-13 DIAGNOSIS — R410 Disorientation, unspecified: Secondary | ICD-10-CM | POA: Diagnosis not present

## 2017-11-13 DIAGNOSIS — E119 Type 2 diabetes mellitus without complications: Secondary | ICD-10-CM | POA: Diagnosis not present

## 2017-11-13 DIAGNOSIS — E860 Dehydration: Secondary | ICD-10-CM | POA: Diagnosis not present

## 2017-11-14 DIAGNOSIS — R569 Unspecified convulsions: Secondary | ICD-10-CM | POA: Diagnosis not present

## 2017-11-14 DIAGNOSIS — G9340 Encephalopathy, unspecified: Secondary | ICD-10-CM | POA: Diagnosis not present

## 2017-11-14 DIAGNOSIS — R410 Disorientation, unspecified: Secondary | ICD-10-CM | POA: Diagnosis not present

## 2017-11-14 DIAGNOSIS — R4781 Slurred speech: Secondary | ICD-10-CM | POA: Diagnosis not present

## 2017-11-14 DIAGNOSIS — R0689 Other abnormalities of breathing: Secondary | ICD-10-CM | POA: Diagnosis not present

## 2017-11-15 DIAGNOSIS — R4182 Altered mental status, unspecified: Secondary | ICD-10-CM | POA: Diagnosis not present

## 2017-11-15 DIAGNOSIS — R569 Unspecified convulsions: Secondary | ICD-10-CM | POA: Diagnosis not present

## 2017-11-16 DIAGNOSIS — F423 Hoarding disorder: Secondary | ICD-10-CM | POA: Diagnosis not present

## 2017-11-29 DIAGNOSIS — G4733 Obstructive sleep apnea (adult) (pediatric): Secondary | ICD-10-CM | POA: Diagnosis not present

## 2017-11-29 DIAGNOSIS — Z299 Encounter for prophylactic measures, unspecified: Secondary | ICD-10-CM | POA: Diagnosis not present

## 2017-11-29 DIAGNOSIS — I10 Essential (primary) hypertension: Secondary | ICD-10-CM | POA: Diagnosis not present

## 2017-11-29 DIAGNOSIS — G459 Transient cerebral ischemic attack, unspecified: Secondary | ICD-10-CM | POA: Diagnosis not present

## 2017-11-29 DIAGNOSIS — E1165 Type 2 diabetes mellitus with hyperglycemia: Secondary | ICD-10-CM | POA: Diagnosis not present

## 2017-11-29 DIAGNOSIS — I2510 Atherosclerotic heart disease of native coronary artery without angina pectoris: Secondary | ICD-10-CM | POA: Diagnosis not present

## 2017-11-29 DIAGNOSIS — Z6836 Body mass index (BMI) 36.0-36.9, adult: Secondary | ICD-10-CM | POA: Diagnosis not present

## 2017-12-13 DIAGNOSIS — Z713 Dietary counseling and surveillance: Secondary | ICD-10-CM | POA: Diagnosis not present

## 2017-12-13 DIAGNOSIS — Z299 Encounter for prophylactic measures, unspecified: Secondary | ICD-10-CM | POA: Diagnosis not present

## 2017-12-13 DIAGNOSIS — E1165 Type 2 diabetes mellitus with hyperglycemia: Secondary | ICD-10-CM | POA: Diagnosis not present

## 2017-12-13 DIAGNOSIS — Z6836 Body mass index (BMI) 36.0-36.9, adult: Secondary | ICD-10-CM | POA: Diagnosis not present

## 2017-12-13 DIAGNOSIS — F419 Anxiety disorder, unspecified: Secondary | ICD-10-CM | POA: Diagnosis not present

## 2017-12-14 DIAGNOSIS — M545 Low back pain: Secondary | ICD-10-CM | POA: Diagnosis not present

## 2017-12-14 DIAGNOSIS — M5410 Radiculopathy, site unspecified: Secondary | ICD-10-CM | POA: Diagnosis not present

## 2017-12-14 DIAGNOSIS — M5416 Radiculopathy, lumbar region: Secondary | ICD-10-CM | POA: Diagnosis not present

## 2017-12-16 DIAGNOSIS — F423 Hoarding disorder: Secondary | ICD-10-CM | POA: Diagnosis not present

## 2017-12-29 DIAGNOSIS — Z299 Encounter for prophylactic measures, unspecified: Secondary | ICD-10-CM | POA: Diagnosis not present

## 2017-12-29 DIAGNOSIS — I10 Essential (primary) hypertension: Secondary | ICD-10-CM | POA: Diagnosis not present

## 2017-12-29 DIAGNOSIS — K140 Glossitis: Secondary | ICD-10-CM | POA: Diagnosis not present

## 2017-12-29 DIAGNOSIS — Z6836 Body mass index (BMI) 36.0-36.9, adult: Secondary | ICD-10-CM | POA: Diagnosis not present

## 2017-12-29 DIAGNOSIS — Z713 Dietary counseling and surveillance: Secondary | ICD-10-CM | POA: Diagnosis not present

## 2018-01-03 DIAGNOSIS — E1140 Type 2 diabetes mellitus with diabetic neuropathy, unspecified: Secondary | ICD-10-CM | POA: Diagnosis not present

## 2018-01-03 DIAGNOSIS — E1151 Type 2 diabetes mellitus with diabetic peripheral angiopathy without gangrene: Secondary | ICD-10-CM | POA: Diagnosis not present

## 2018-01-12 DIAGNOSIS — K140 Glossitis: Secondary | ICD-10-CM | POA: Diagnosis not present

## 2018-01-12 DIAGNOSIS — E1165 Type 2 diabetes mellitus with hyperglycemia: Secondary | ICD-10-CM | POA: Diagnosis not present

## 2018-01-12 DIAGNOSIS — Z6836 Body mass index (BMI) 36.0-36.9, adult: Secondary | ICD-10-CM | POA: Diagnosis not present

## 2018-01-12 DIAGNOSIS — Z299 Encounter for prophylactic measures, unspecified: Secondary | ICD-10-CM | POA: Diagnosis not present

## 2018-01-12 DIAGNOSIS — I10 Essential (primary) hypertension: Secondary | ICD-10-CM | POA: Diagnosis not present

## 2018-01-24 DIAGNOSIS — I10 Essential (primary) hypertension: Secondary | ICD-10-CM | POA: Diagnosis not present

## 2018-01-24 DIAGNOSIS — Z299 Encounter for prophylactic measures, unspecified: Secondary | ICD-10-CM | POA: Diagnosis not present

## 2018-01-24 DIAGNOSIS — Z6836 Body mass index (BMI) 36.0-36.9, adult: Secondary | ICD-10-CM | POA: Diagnosis not present

## 2018-01-24 DIAGNOSIS — Z713 Dietary counseling and surveillance: Secondary | ICD-10-CM | POA: Diagnosis not present

## 2018-01-28 DIAGNOSIS — Z299 Encounter for prophylactic measures, unspecified: Secondary | ICD-10-CM | POA: Diagnosis not present

## 2018-01-28 DIAGNOSIS — E7800 Pure hypercholesterolemia, unspecified: Secondary | ICD-10-CM | POA: Diagnosis not present

## 2018-01-28 DIAGNOSIS — M8560 Other cyst of bone, unspecified site: Secondary | ICD-10-CM | POA: Diagnosis not present

## 2018-01-28 DIAGNOSIS — E1165 Type 2 diabetes mellitus with hyperglycemia: Secondary | ICD-10-CM | POA: Diagnosis not present

## 2018-01-28 DIAGNOSIS — I10 Essential (primary) hypertension: Secondary | ICD-10-CM | POA: Diagnosis not present

## 2018-01-28 DIAGNOSIS — Z6837 Body mass index (BMI) 37.0-37.9, adult: Secondary | ICD-10-CM | POA: Diagnosis not present

## 2018-01-31 DIAGNOSIS — R2232 Localized swelling, mass and lump, left upper limb: Secondary | ICD-10-CM | POA: Diagnosis not present

## 2018-01-31 DIAGNOSIS — I10 Essential (primary) hypertension: Secondary | ICD-10-CM | POA: Diagnosis not present

## 2018-02-07 DIAGNOSIS — M545 Low back pain: Secondary | ICD-10-CM | POA: Diagnosis not present

## 2018-02-07 DIAGNOSIS — M5410 Radiculopathy, site unspecified: Secondary | ICD-10-CM | POA: Diagnosis not present

## 2018-02-07 DIAGNOSIS — M4726 Other spondylosis with radiculopathy, lumbar region: Secondary | ICD-10-CM | POA: Diagnosis not present

## 2018-02-07 DIAGNOSIS — M48062 Spinal stenosis, lumbar region with neurogenic claudication: Secondary | ICD-10-CM | POA: Diagnosis not present

## 2018-02-08 DIAGNOSIS — M25522 Pain in left elbow: Secondary | ICD-10-CM | POA: Diagnosis not present

## 2018-02-16 DIAGNOSIS — Z299 Encounter for prophylactic measures, unspecified: Secondary | ICD-10-CM | POA: Diagnosis not present

## 2018-02-16 DIAGNOSIS — R2232 Localized swelling, mass and lump, left upper limb: Secondary | ICD-10-CM | POA: Diagnosis not present

## 2018-02-16 DIAGNOSIS — Z6837 Body mass index (BMI) 37.0-37.9, adult: Secondary | ICD-10-CM | POA: Diagnosis not present

## 2018-02-16 DIAGNOSIS — R350 Frequency of micturition: Secondary | ICD-10-CM | POA: Diagnosis not present

## 2018-02-16 DIAGNOSIS — I10 Essential (primary) hypertension: Secondary | ICD-10-CM | POA: Diagnosis not present

## 2018-02-16 DIAGNOSIS — E1165 Type 2 diabetes mellitus with hyperglycemia: Secondary | ICD-10-CM | POA: Diagnosis not present

## 2018-02-24 DIAGNOSIS — F423 Hoarding disorder: Secondary | ICD-10-CM | POA: Diagnosis not present

## 2018-03-09 DIAGNOSIS — F423 Hoarding disorder: Secondary | ICD-10-CM | POA: Diagnosis not present

## 2018-03-10 DIAGNOSIS — E119 Type 2 diabetes mellitus without complications: Secondary | ICD-10-CM | POA: Diagnosis not present

## 2018-03-10 DIAGNOSIS — I10 Essential (primary) hypertension: Secondary | ICD-10-CM | POA: Diagnosis not present

## 2018-03-10 DIAGNOSIS — I2510 Atherosclerotic heart disease of native coronary artery without angina pectoris: Secondary | ICD-10-CM | POA: Diagnosis not present

## 2018-03-12 DIAGNOSIS — I959 Hypotension, unspecified: Secondary | ICD-10-CM | POA: Diagnosis not present

## 2018-03-12 DIAGNOSIS — R112 Nausea with vomiting, unspecified: Secondary | ICD-10-CM | POA: Diagnosis not present

## 2018-03-12 DIAGNOSIS — R0902 Hypoxemia: Secondary | ICD-10-CM | POA: Diagnosis not present

## 2018-03-12 DIAGNOSIS — R110 Nausea: Secondary | ICD-10-CM | POA: Diagnosis not present

## 2018-03-12 DIAGNOSIS — R1111 Vomiting without nausea: Secondary | ICD-10-CM | POA: Diagnosis not present

## 2018-03-12 DIAGNOSIS — K529 Noninfective gastroenteritis and colitis, unspecified: Secondary | ICD-10-CM | POA: Diagnosis not present

## 2018-03-18 DIAGNOSIS — I10 Essential (primary) hypertension: Secondary | ICD-10-CM | POA: Diagnosis not present

## 2018-03-18 DIAGNOSIS — Z299 Encounter for prophylactic measures, unspecified: Secondary | ICD-10-CM | POA: Diagnosis not present

## 2018-03-18 DIAGNOSIS — Z6837 Body mass index (BMI) 37.0-37.9, adult: Secondary | ICD-10-CM | POA: Diagnosis not present

## 2018-03-18 DIAGNOSIS — E1165 Type 2 diabetes mellitus with hyperglycemia: Secondary | ICD-10-CM | POA: Diagnosis not present

## 2018-03-21 DIAGNOSIS — E1151 Type 2 diabetes mellitus with diabetic peripheral angiopathy without gangrene: Secondary | ICD-10-CM | POA: Diagnosis not present

## 2018-03-21 DIAGNOSIS — E1140 Type 2 diabetes mellitus with diabetic neuropathy, unspecified: Secondary | ICD-10-CM | POA: Diagnosis not present

## 2018-03-23 DIAGNOSIS — F423 Hoarding disorder: Secondary | ICD-10-CM | POA: Diagnosis not present

## 2018-03-29 DIAGNOSIS — I2510 Atherosclerotic heart disease of native coronary artery without angina pectoris: Secondary | ICD-10-CM | POA: Diagnosis not present

## 2018-03-29 DIAGNOSIS — E119 Type 2 diabetes mellitus without complications: Secondary | ICD-10-CM | POA: Diagnosis not present

## 2018-03-29 DIAGNOSIS — G4730 Sleep apnea, unspecified: Secondary | ICD-10-CM | POA: Diagnosis not present

## 2018-03-29 DIAGNOSIS — I10 Essential (primary) hypertension: Secondary | ICD-10-CM | POA: Diagnosis not present

## 2018-03-29 DIAGNOSIS — R4182 Altered mental status, unspecified: Secondary | ICD-10-CM | POA: Diagnosis not present

## 2018-04-11 DIAGNOSIS — F423 Hoarding disorder: Secondary | ICD-10-CM | POA: Diagnosis not present

## 2018-04-27 DIAGNOSIS — F423 Hoarding disorder: Secondary | ICD-10-CM | POA: Diagnosis not present

## 2018-05-02 DIAGNOSIS — M76822 Posterior tibial tendinitis, left leg: Secondary | ICD-10-CM | POA: Diagnosis not present

## 2018-05-02 DIAGNOSIS — M25775 Osteophyte, left foot: Secondary | ICD-10-CM | POA: Diagnosis not present

## 2018-05-02 DIAGNOSIS — M79672 Pain in left foot: Secondary | ICD-10-CM | POA: Diagnosis not present

## 2018-05-03 DIAGNOSIS — Z79899 Other long term (current) drug therapy: Secondary | ICD-10-CM | POA: Diagnosis not present

## 2018-05-03 DIAGNOSIS — Z87891 Personal history of nicotine dependence: Secondary | ICD-10-CM | POA: Diagnosis not present

## 2018-05-03 DIAGNOSIS — Z1339 Encounter for screening examination for other mental health and behavioral disorders: Secondary | ICD-10-CM | POA: Diagnosis not present

## 2018-05-03 DIAGNOSIS — Z299 Encounter for prophylactic measures, unspecified: Secondary | ICD-10-CM | POA: Diagnosis not present

## 2018-05-03 DIAGNOSIS — Z7189 Other specified counseling: Secondary | ICD-10-CM | POA: Diagnosis not present

## 2018-05-03 DIAGNOSIS — I10 Essential (primary) hypertension: Secondary | ICD-10-CM | POA: Diagnosis not present

## 2018-05-03 DIAGNOSIS — Z1211 Encounter for screening for malignant neoplasm of colon: Secondary | ICD-10-CM | POA: Diagnosis not present

## 2018-05-03 DIAGNOSIS — R5383 Other fatigue: Secondary | ICD-10-CM | POA: Diagnosis not present

## 2018-05-03 DIAGNOSIS — R42 Dizziness and giddiness: Secondary | ICD-10-CM | POA: Diagnosis not present

## 2018-05-03 DIAGNOSIS — Z1331 Encounter for screening for depression: Secondary | ICD-10-CM | POA: Diagnosis not present

## 2018-05-03 DIAGNOSIS — Z0000 Encounter for general adult medical examination without abnormal findings: Secondary | ICD-10-CM | POA: Diagnosis not present

## 2018-05-03 DIAGNOSIS — Z6837 Body mass index (BMI) 37.0-37.9, adult: Secondary | ICD-10-CM | POA: Diagnosis not present

## 2018-05-03 DIAGNOSIS — Z125 Encounter for screening for malignant neoplasm of prostate: Secondary | ICD-10-CM | POA: Diagnosis not present

## 2018-05-03 DIAGNOSIS — E7800 Pure hypercholesterolemia, unspecified: Secondary | ICD-10-CM | POA: Diagnosis not present

## 2018-05-17 DIAGNOSIS — R351 Nocturia: Secondary | ICD-10-CM | POA: Diagnosis not present

## 2018-05-17 DIAGNOSIS — N401 Enlarged prostate with lower urinary tract symptoms: Secondary | ICD-10-CM | POA: Diagnosis not present

## 2018-05-17 DIAGNOSIS — R1032 Left lower quadrant pain: Secondary | ICD-10-CM | POA: Diagnosis not present

## 2018-05-17 DIAGNOSIS — N528 Other male erectile dysfunction: Secondary | ICD-10-CM | POA: Diagnosis not present

## 2018-05-17 DIAGNOSIS — N200 Calculus of kidney: Secondary | ICD-10-CM | POA: Diagnosis not present

## 2018-05-18 DIAGNOSIS — M5416 Radiculopathy, lumbar region: Secondary | ICD-10-CM | POA: Diagnosis not present

## 2018-05-18 DIAGNOSIS — M5410 Radiculopathy, site unspecified: Secondary | ICD-10-CM | POA: Diagnosis not present

## 2018-05-18 DIAGNOSIS — M545 Low back pain: Secondary | ICD-10-CM | POA: Diagnosis not present

## 2018-05-24 DIAGNOSIS — E1140 Type 2 diabetes mellitus with diabetic neuropathy, unspecified: Secondary | ICD-10-CM | POA: Diagnosis not present

## 2018-05-24 DIAGNOSIS — H81393 Other peripheral vertigo, bilateral: Secondary | ICD-10-CM | POA: Diagnosis not present

## 2018-05-24 DIAGNOSIS — E1159 Type 2 diabetes mellitus with other circulatory complications: Secondary | ICD-10-CM | POA: Diagnosis not present

## 2018-05-25 DIAGNOSIS — G4733 Obstructive sleep apnea (adult) (pediatric): Secondary | ICD-10-CM | POA: Diagnosis not present

## 2018-05-25 DIAGNOSIS — F423 Hoarding disorder: Secondary | ICD-10-CM | POA: Diagnosis not present

## 2018-05-25 DIAGNOSIS — Z299 Encounter for prophylactic measures, unspecified: Secondary | ICD-10-CM | POA: Diagnosis not present

## 2018-05-25 DIAGNOSIS — Z6836 Body mass index (BMI) 36.0-36.9, adult: Secondary | ICD-10-CM | POA: Diagnosis not present

## 2018-05-25 DIAGNOSIS — I10 Essential (primary) hypertension: Secondary | ICD-10-CM | POA: Diagnosis not present

## 2018-05-25 DIAGNOSIS — E1165 Type 2 diabetes mellitus with hyperglycemia: Secondary | ICD-10-CM | POA: Diagnosis not present

## 2018-05-26 ENCOUNTER — Other Ambulatory Visit: Payer: Self-pay | Admitting: Orthopedic Surgery

## 2018-05-26 DIAGNOSIS — M67232 Synovial hypertrophy, not elsewhere classified, left forearm: Secondary | ICD-10-CM | POA: Diagnosis not present

## 2018-05-26 DIAGNOSIS — M7989 Other specified soft tissue disorders: Secondary | ICD-10-CM | POA: Diagnosis not present

## 2018-05-26 DIAGNOSIS — D2112 Benign neoplasm of connective and other soft tissue of left upper limb, including shoulder: Secondary | ICD-10-CM | POA: Diagnosis not present

## 2018-06-06 DIAGNOSIS — E1151 Type 2 diabetes mellitus with diabetic peripheral angiopathy without gangrene: Secondary | ICD-10-CM | POA: Diagnosis not present

## 2018-06-06 DIAGNOSIS — E1140 Type 2 diabetes mellitus with diabetic neuropathy, unspecified: Secondary | ICD-10-CM | POA: Diagnosis not present

## 2018-06-08 DIAGNOSIS — M25522 Pain in left elbow: Secondary | ICD-10-CM | POA: Diagnosis not present

## 2018-06-12 DIAGNOSIS — S5001XA Contusion of right elbow, initial encounter: Secondary | ICD-10-CM | POA: Diagnosis not present

## 2018-06-12 DIAGNOSIS — S299XXA Unspecified injury of thorax, initial encounter: Secondary | ICD-10-CM | POA: Diagnosis not present

## 2018-06-12 DIAGNOSIS — R079 Chest pain, unspecified: Secondary | ICD-10-CM | POA: Diagnosis not present

## 2018-06-12 DIAGNOSIS — L7622 Postprocedural hemorrhage and hematoma of skin and subcutaneous tissue following other procedure: Secondary | ICD-10-CM | POA: Diagnosis not present

## 2018-06-12 DIAGNOSIS — S20221A Contusion of right back wall of thorax, initial encounter: Secondary | ICD-10-CM | POA: Diagnosis not present

## 2018-06-12 DIAGNOSIS — S51822A Laceration with foreign body of left forearm, initial encounter: Secondary | ICD-10-CM | POA: Diagnosis not present

## 2018-06-12 DIAGNOSIS — M546 Pain in thoracic spine: Secondary | ICD-10-CM | POA: Diagnosis not present

## 2018-06-12 DIAGNOSIS — T1490XA Injury, unspecified, initial encounter: Secondary | ICD-10-CM | POA: Diagnosis not present

## 2018-06-27 DIAGNOSIS — H35371 Puckering of macula, right eye: Secondary | ICD-10-CM | POA: Diagnosis not present

## 2018-06-27 DIAGNOSIS — Z299 Encounter for prophylactic measures, unspecified: Secondary | ICD-10-CM | POA: Diagnosis not present

## 2018-06-27 DIAGNOSIS — H3540 Unspecified peripheral retinal degeneration: Secondary | ICD-10-CM | POA: Diagnosis not present

## 2018-06-27 DIAGNOSIS — Z6836 Body mass index (BMI) 36.0-36.9, adult: Secondary | ICD-10-CM | POA: Diagnosis not present

## 2018-06-27 DIAGNOSIS — H04123 Dry eye syndrome of bilateral lacrimal glands: Secondary | ICD-10-CM | POA: Diagnosis not present

## 2018-06-27 DIAGNOSIS — I10 Essential (primary) hypertension: Secondary | ICD-10-CM | POA: Diagnosis not present

## 2018-06-27 DIAGNOSIS — E119 Type 2 diabetes mellitus without complications: Secondary | ICD-10-CM | POA: Diagnosis not present

## 2018-06-27 DIAGNOSIS — E1165 Type 2 diabetes mellitus with hyperglycemia: Secondary | ICD-10-CM | POA: Diagnosis not present

## 2018-06-27 DIAGNOSIS — M549 Dorsalgia, unspecified: Secondary | ICD-10-CM | POA: Diagnosis not present

## 2018-07-27 DIAGNOSIS — F423 Hoarding disorder: Secondary | ICD-10-CM | POA: Diagnosis not present

## 2018-08-03 DIAGNOSIS — Z6836 Body mass index (BMI) 36.0-36.9, adult: Secondary | ICD-10-CM | POA: Diagnosis not present

## 2018-08-03 DIAGNOSIS — Z299 Encounter for prophylactic measures, unspecified: Secondary | ICD-10-CM | POA: Diagnosis not present

## 2018-08-03 DIAGNOSIS — I10 Essential (primary) hypertension: Secondary | ICD-10-CM | POA: Diagnosis not present

## 2018-08-03 DIAGNOSIS — R42 Dizziness and giddiness: Secondary | ICD-10-CM | POA: Diagnosis not present

## 2018-08-03 DIAGNOSIS — F419 Anxiety disorder, unspecified: Secondary | ICD-10-CM | POA: Diagnosis not present

## 2018-08-10 DIAGNOSIS — F423 Hoarding disorder: Secondary | ICD-10-CM | POA: Diagnosis not present

## 2018-08-22 DIAGNOSIS — E1140 Type 2 diabetes mellitus with diabetic neuropathy, unspecified: Secondary | ICD-10-CM | POA: Diagnosis not present

## 2018-08-22 DIAGNOSIS — E1151 Type 2 diabetes mellitus with diabetic peripheral angiopathy without gangrene: Secondary | ICD-10-CM | POA: Diagnosis not present

## 2018-08-30 DIAGNOSIS — Z299 Encounter for prophylactic measures, unspecified: Secondary | ICD-10-CM | POA: Diagnosis not present

## 2018-08-30 DIAGNOSIS — Z6835 Body mass index (BMI) 35.0-35.9, adult: Secondary | ICD-10-CM | POA: Diagnosis not present

## 2018-08-30 DIAGNOSIS — I10 Essential (primary) hypertension: Secondary | ICD-10-CM | POA: Diagnosis not present

## 2018-08-30 DIAGNOSIS — E1165 Type 2 diabetes mellitus with hyperglycemia: Secondary | ICD-10-CM | POA: Diagnosis not present

## 2018-08-30 DIAGNOSIS — G4733 Obstructive sleep apnea (adult) (pediatric): Secondary | ICD-10-CM | POA: Diagnosis not present

## 2018-09-01 DIAGNOSIS — F423 Hoarding disorder: Secondary | ICD-10-CM | POA: Diagnosis not present

## 2018-09-14 DIAGNOSIS — R531 Weakness: Secondary | ICD-10-CM | POA: Diagnosis not present

## 2018-09-14 DIAGNOSIS — R296 Repeated falls: Secondary | ICD-10-CM | POA: Diagnosis not present

## 2018-09-14 DIAGNOSIS — R2689 Other abnormalities of gait and mobility: Secondary | ICD-10-CM | POA: Diagnosis not present

## 2018-09-14 DIAGNOSIS — R2681 Unsteadiness on feet: Secondary | ICD-10-CM | POA: Diagnosis not present

## 2018-09-15 DIAGNOSIS — R2689 Other abnormalities of gait and mobility: Secondary | ICD-10-CM | POA: Diagnosis not present

## 2018-09-15 DIAGNOSIS — R296 Repeated falls: Secondary | ICD-10-CM | POA: Diagnosis not present

## 2018-09-15 DIAGNOSIS — R2681 Unsteadiness on feet: Secondary | ICD-10-CM | POA: Diagnosis not present

## 2018-09-15 DIAGNOSIS — R531 Weakness: Secondary | ICD-10-CM | POA: Diagnosis not present

## 2018-09-19 DIAGNOSIS — R531 Weakness: Secondary | ICD-10-CM | POA: Diagnosis not present

## 2018-09-19 DIAGNOSIS — R2689 Other abnormalities of gait and mobility: Secondary | ICD-10-CM | POA: Diagnosis not present

## 2018-09-19 DIAGNOSIS — R296 Repeated falls: Secondary | ICD-10-CM | POA: Diagnosis not present

## 2018-09-19 DIAGNOSIS — R2681 Unsteadiness on feet: Secondary | ICD-10-CM | POA: Diagnosis not present

## 2018-09-20 DIAGNOSIS — F423 Hoarding disorder: Secondary | ICD-10-CM | POA: Diagnosis not present

## 2018-09-22 DIAGNOSIS — R531 Weakness: Secondary | ICD-10-CM | POA: Diagnosis not present

## 2018-09-22 DIAGNOSIS — R296 Repeated falls: Secondary | ICD-10-CM | POA: Diagnosis not present

## 2018-09-22 DIAGNOSIS — R2689 Other abnormalities of gait and mobility: Secondary | ICD-10-CM | POA: Diagnosis not present

## 2018-09-22 DIAGNOSIS — R2681 Unsteadiness on feet: Secondary | ICD-10-CM | POA: Diagnosis not present

## 2018-09-23 DIAGNOSIS — R296 Repeated falls: Secondary | ICD-10-CM | POA: Diagnosis not present

## 2018-09-23 DIAGNOSIS — R531 Weakness: Secondary | ICD-10-CM | POA: Diagnosis not present

## 2018-09-23 DIAGNOSIS — R2681 Unsteadiness on feet: Secondary | ICD-10-CM | POA: Diagnosis not present

## 2018-09-23 DIAGNOSIS — R2689 Other abnormalities of gait and mobility: Secondary | ICD-10-CM | POA: Diagnosis not present

## 2018-09-26 DIAGNOSIS — R2681 Unsteadiness on feet: Secondary | ICD-10-CM | POA: Diagnosis not present

## 2018-09-26 DIAGNOSIS — R296 Repeated falls: Secondary | ICD-10-CM | POA: Diagnosis not present

## 2018-09-26 DIAGNOSIS — R531 Weakness: Secondary | ICD-10-CM | POA: Diagnosis not present

## 2018-09-26 DIAGNOSIS — R2689 Other abnormalities of gait and mobility: Secondary | ICD-10-CM | POA: Diagnosis not present

## 2018-09-28 DIAGNOSIS — R296 Repeated falls: Secondary | ICD-10-CM | POA: Diagnosis not present

## 2018-09-28 DIAGNOSIS — R531 Weakness: Secondary | ICD-10-CM | POA: Diagnosis not present

## 2018-09-28 DIAGNOSIS — R2689 Other abnormalities of gait and mobility: Secondary | ICD-10-CM | POA: Diagnosis not present

## 2018-09-28 DIAGNOSIS — R2681 Unsteadiness on feet: Secondary | ICD-10-CM | POA: Diagnosis not present

## 2018-09-29 DIAGNOSIS — R4182 Altered mental status, unspecified: Secondary | ICD-10-CM | POA: Diagnosis not present

## 2018-09-29 DIAGNOSIS — I10 Essential (primary) hypertension: Secondary | ICD-10-CM | POA: Diagnosis not present

## 2018-09-29 DIAGNOSIS — R002 Palpitations: Secondary | ICD-10-CM | POA: Diagnosis not present

## 2018-09-29 DIAGNOSIS — I951 Orthostatic hypotension: Secondary | ICD-10-CM | POA: Diagnosis not present

## 2018-09-29 DIAGNOSIS — I2510 Atherosclerotic heart disease of native coronary artery without angina pectoris: Secondary | ICD-10-CM | POA: Diagnosis not present

## 2018-09-29 DIAGNOSIS — E119 Type 2 diabetes mellitus without complications: Secondary | ICD-10-CM | POA: Diagnosis not present

## 2018-09-29 DIAGNOSIS — G4730 Sleep apnea, unspecified: Secondary | ICD-10-CM | POA: Diagnosis not present

## 2018-09-29 DIAGNOSIS — R55 Syncope and collapse: Secondary | ICD-10-CM | POA: Diagnosis not present

## 2018-09-30 DIAGNOSIS — R296 Repeated falls: Secondary | ICD-10-CM | POA: Diagnosis not present

## 2018-09-30 DIAGNOSIS — R531 Weakness: Secondary | ICD-10-CM | POA: Diagnosis not present

## 2018-09-30 DIAGNOSIS — R2681 Unsteadiness on feet: Secondary | ICD-10-CM | POA: Diagnosis not present

## 2018-09-30 DIAGNOSIS — R2689 Other abnormalities of gait and mobility: Secondary | ICD-10-CM | POA: Diagnosis not present

## 2018-10-03 DIAGNOSIS — R2689 Other abnormalities of gait and mobility: Secondary | ICD-10-CM | POA: Diagnosis not present

## 2018-10-03 DIAGNOSIS — R296 Repeated falls: Secondary | ICD-10-CM | POA: Diagnosis not present

## 2018-10-03 DIAGNOSIS — R531 Weakness: Secondary | ICD-10-CM | POA: Diagnosis not present

## 2018-10-03 DIAGNOSIS — R2681 Unsteadiness on feet: Secondary | ICD-10-CM | POA: Diagnosis not present

## 2018-10-04 DIAGNOSIS — F423 Hoarding disorder: Secondary | ICD-10-CM | POA: Diagnosis not present

## 2018-10-06 DIAGNOSIS — R55 Syncope and collapse: Secondary | ICD-10-CM | POA: Diagnosis not present

## 2018-10-07 DIAGNOSIS — R2681 Unsteadiness on feet: Secondary | ICD-10-CM | POA: Diagnosis not present

## 2018-10-07 DIAGNOSIS — R2689 Other abnormalities of gait and mobility: Secondary | ICD-10-CM | POA: Diagnosis not present

## 2018-10-07 DIAGNOSIS — R531 Weakness: Secondary | ICD-10-CM | POA: Diagnosis not present

## 2018-10-07 DIAGNOSIS — R296 Repeated falls: Secondary | ICD-10-CM | POA: Diagnosis not present

## 2018-10-12 DIAGNOSIS — R296 Repeated falls: Secondary | ICD-10-CM | POA: Diagnosis not present

## 2018-10-12 DIAGNOSIS — R531 Weakness: Secondary | ICD-10-CM | POA: Diagnosis not present

## 2018-10-12 DIAGNOSIS — E119 Type 2 diabetes mellitus without complications: Secondary | ICD-10-CM | POA: Diagnosis not present

## 2018-10-12 DIAGNOSIS — Z955 Presence of coronary angioplasty implant and graft: Secondary | ICD-10-CM | POA: Diagnosis not present

## 2018-10-12 DIAGNOSIS — S134XXA Sprain of ligaments of cervical spine, initial encounter: Secondary | ICD-10-CM | POA: Diagnosis not present

## 2018-10-12 DIAGNOSIS — I10 Essential (primary) hypertension: Secondary | ICD-10-CM | POA: Diagnosis not present

## 2018-10-12 DIAGNOSIS — R52 Pain, unspecified: Secondary | ICD-10-CM | POA: Diagnosis not present

## 2018-10-12 DIAGNOSIS — Z9049 Acquired absence of other specified parts of digestive tract: Secondary | ICD-10-CM | POA: Diagnosis not present

## 2018-10-12 DIAGNOSIS — Z7902 Long term (current) use of antithrombotics/antiplatelets: Secondary | ICD-10-CM | POA: Diagnosis not present

## 2018-10-12 DIAGNOSIS — Z794 Long term (current) use of insulin: Secondary | ICD-10-CM | POA: Diagnosis not present

## 2018-10-12 DIAGNOSIS — Z7984 Long term (current) use of oral hypoglycemic drugs: Secondary | ICD-10-CM | POA: Diagnosis not present

## 2018-10-12 DIAGNOSIS — I252 Old myocardial infarction: Secondary | ICD-10-CM | POA: Diagnosis not present

## 2018-10-12 DIAGNOSIS — R102 Pelvic and perineal pain: Secondary | ICD-10-CM | POA: Diagnosis not present

## 2018-10-12 DIAGNOSIS — R079 Chest pain, unspecified: Secondary | ICD-10-CM | POA: Diagnosis not present

## 2018-10-12 DIAGNOSIS — R2681 Unsteadiness on feet: Secondary | ICD-10-CM | POA: Diagnosis not present

## 2018-10-12 DIAGNOSIS — F419 Anxiety disorder, unspecified: Secondary | ICD-10-CM | POA: Diagnosis not present

## 2018-10-12 DIAGNOSIS — R0902 Hypoxemia: Secondary | ICD-10-CM | POA: Diagnosis not present

## 2018-10-12 DIAGNOSIS — Z79899 Other long term (current) drug therapy: Secondary | ICD-10-CM | POA: Diagnosis not present

## 2018-10-12 DIAGNOSIS — Z7901 Long term (current) use of anticoagulants: Secondary | ICD-10-CM | POA: Diagnosis not present

## 2018-10-12 DIAGNOSIS — S199XXA Unspecified injury of neck, initial encounter: Secondary | ICD-10-CM | POA: Diagnosis not present

## 2018-10-12 DIAGNOSIS — S0990XA Unspecified injury of head, initial encounter: Secondary | ICD-10-CM | POA: Diagnosis not present

## 2018-10-12 DIAGNOSIS — S098XXA Other specified injuries of head, initial encounter: Secondary | ICD-10-CM | POA: Diagnosis not present

## 2018-10-12 DIAGNOSIS — M5489 Other dorsalgia: Secondary | ICD-10-CM | POA: Diagnosis not present

## 2018-10-12 DIAGNOSIS — R2689 Other abnormalities of gait and mobility: Secondary | ICD-10-CM | POA: Diagnosis not present

## 2018-10-12 DIAGNOSIS — Z9889 Other specified postprocedural states: Secondary | ICD-10-CM | POA: Diagnosis not present

## 2018-10-12 DIAGNOSIS — S3993XA Unspecified injury of pelvis, initial encounter: Secondary | ICD-10-CM | POA: Diagnosis not present

## 2018-10-12 DIAGNOSIS — S299XXA Unspecified injury of thorax, initial encounter: Secondary | ICD-10-CM | POA: Diagnosis not present

## 2018-10-12 DIAGNOSIS — R51 Headache: Secondary | ICD-10-CM | POA: Diagnosis not present

## 2018-10-12 DIAGNOSIS — M542 Cervicalgia: Secondary | ICD-10-CM | POA: Diagnosis not present

## 2018-10-13 DIAGNOSIS — F423 Hoarding disorder: Secondary | ICD-10-CM | POA: Diagnosis not present

## 2018-10-14 DIAGNOSIS — M791 Myalgia, unspecified site: Secondary | ICD-10-CM | POA: Diagnosis not present

## 2018-10-14 DIAGNOSIS — G459 Transient cerebral ischemic attack, unspecified: Secondary | ICD-10-CM | POA: Diagnosis not present

## 2018-10-14 DIAGNOSIS — I10 Essential (primary) hypertension: Secondary | ICD-10-CM | POA: Diagnosis not present

## 2018-10-14 DIAGNOSIS — R55 Syncope and collapse: Secondary | ICD-10-CM | POA: Diagnosis not present

## 2018-10-14 DIAGNOSIS — Z6834 Body mass index (BMI) 34.0-34.9, adult: Secondary | ICD-10-CM | POA: Diagnosis not present

## 2018-10-14 DIAGNOSIS — Z299 Encounter for prophylactic measures, unspecified: Secondary | ICD-10-CM | POA: Diagnosis not present

## 2018-10-14 DIAGNOSIS — E7800 Pure hypercholesterolemia, unspecified: Secondary | ICD-10-CM | POA: Diagnosis not present

## 2018-10-19 DIAGNOSIS — R531 Weakness: Secondary | ICD-10-CM | POA: Diagnosis not present

## 2018-10-19 DIAGNOSIS — M542 Cervicalgia: Secondary | ICD-10-CM | POA: Diagnosis not present

## 2018-10-19 DIAGNOSIS — R2681 Unsteadiness on feet: Secondary | ICD-10-CM | POA: Diagnosis not present

## 2018-10-19 DIAGNOSIS — R296 Repeated falls: Secondary | ICD-10-CM | POA: Diagnosis not present

## 2018-10-19 DIAGNOSIS — R2689 Other abnormalities of gait and mobility: Secondary | ICD-10-CM | POA: Diagnosis not present

## 2018-10-20 DIAGNOSIS — R55 Syncope and collapse: Secondary | ICD-10-CM | POA: Diagnosis not present

## 2018-10-25 DIAGNOSIS — R2681 Unsteadiness on feet: Secondary | ICD-10-CM | POA: Diagnosis not present

## 2018-10-25 DIAGNOSIS — R531 Weakness: Secondary | ICD-10-CM | POA: Diagnosis not present

## 2018-10-25 DIAGNOSIS — R2689 Other abnormalities of gait and mobility: Secondary | ICD-10-CM | POA: Diagnosis not present

## 2018-10-25 DIAGNOSIS — R296 Repeated falls: Secondary | ICD-10-CM | POA: Diagnosis not present

## 2018-10-25 DIAGNOSIS — M542 Cervicalgia: Secondary | ICD-10-CM | POA: Diagnosis not present

## 2018-10-26 DIAGNOSIS — R531 Weakness: Secondary | ICD-10-CM | POA: Diagnosis not present

## 2018-10-26 DIAGNOSIS — R2689 Other abnormalities of gait and mobility: Secondary | ICD-10-CM | POA: Diagnosis not present

## 2018-10-26 DIAGNOSIS — M542 Cervicalgia: Secondary | ICD-10-CM | POA: Diagnosis not present

## 2018-10-26 DIAGNOSIS — R296 Repeated falls: Secondary | ICD-10-CM | POA: Diagnosis not present

## 2018-10-26 DIAGNOSIS — R2681 Unsteadiness on feet: Secondary | ICD-10-CM | POA: Diagnosis not present

## 2018-10-26 DIAGNOSIS — F423 Hoarding disorder: Secondary | ICD-10-CM | POA: Diagnosis not present

## 2018-10-27 DIAGNOSIS — I2510 Atherosclerotic heart disease of native coronary artery without angina pectoris: Secondary | ICD-10-CM | POA: Diagnosis not present

## 2018-10-27 DIAGNOSIS — I498 Other specified cardiac arrhythmias: Secondary | ICD-10-CM | POA: Diagnosis not present

## 2018-10-28 DIAGNOSIS — R2681 Unsteadiness on feet: Secondary | ICD-10-CM | POA: Diagnosis not present

## 2018-10-28 DIAGNOSIS — R296 Repeated falls: Secondary | ICD-10-CM | POA: Diagnosis not present

## 2018-10-28 DIAGNOSIS — R531 Weakness: Secondary | ICD-10-CM | POA: Diagnosis not present

## 2018-10-28 DIAGNOSIS — M542 Cervicalgia: Secondary | ICD-10-CM | POA: Diagnosis not present

## 2018-10-28 DIAGNOSIS — R2689 Other abnormalities of gait and mobility: Secondary | ICD-10-CM | POA: Diagnosis not present

## 2018-10-31 DIAGNOSIS — H01023 Squamous blepharitis right eye, unspecified eyelid: Secondary | ICD-10-CM | POA: Diagnosis not present

## 2018-11-01 DIAGNOSIS — R531 Weakness: Secondary | ICD-10-CM | POA: Diagnosis not present

## 2018-11-01 DIAGNOSIS — R2689 Other abnormalities of gait and mobility: Secondary | ICD-10-CM | POA: Diagnosis not present

## 2018-11-01 DIAGNOSIS — M542 Cervicalgia: Secondary | ICD-10-CM | POA: Diagnosis not present

## 2018-11-01 DIAGNOSIS — R2681 Unsteadiness on feet: Secondary | ICD-10-CM | POA: Diagnosis not present

## 2018-11-01 DIAGNOSIS — R296 Repeated falls: Secondary | ICD-10-CM | POA: Diagnosis not present

## 2018-11-02 DIAGNOSIS — M542 Cervicalgia: Secondary | ICD-10-CM | POA: Diagnosis not present

## 2018-11-02 DIAGNOSIS — R531 Weakness: Secondary | ICD-10-CM | POA: Diagnosis not present

## 2018-11-02 DIAGNOSIS — R296 Repeated falls: Secondary | ICD-10-CM | POA: Diagnosis not present

## 2018-11-02 DIAGNOSIS — R2689 Other abnormalities of gait and mobility: Secondary | ICD-10-CM | POA: Diagnosis not present

## 2018-11-02 DIAGNOSIS — R2681 Unsteadiness on feet: Secondary | ICD-10-CM | POA: Diagnosis not present

## 2018-11-04 DIAGNOSIS — R2681 Unsteadiness on feet: Secondary | ICD-10-CM | POA: Diagnosis not present

## 2018-11-04 DIAGNOSIS — H01023 Squamous blepharitis right eye, unspecified eyelid: Secondary | ICD-10-CM | POA: Diagnosis not present

## 2018-11-04 DIAGNOSIS — H01021 Squamous blepharitis right upper eyelid: Secondary | ICD-10-CM | POA: Diagnosis not present

## 2018-11-04 DIAGNOSIS — R531 Weakness: Secondary | ICD-10-CM | POA: Diagnosis not present

## 2018-11-04 DIAGNOSIS — M542 Cervicalgia: Secondary | ICD-10-CM | POA: Diagnosis not present

## 2018-11-04 DIAGNOSIS — R2689 Other abnormalities of gait and mobility: Secondary | ICD-10-CM | POA: Diagnosis not present

## 2018-11-04 DIAGNOSIS — R296 Repeated falls: Secondary | ICD-10-CM | POA: Diagnosis not present

## 2018-11-07 DIAGNOSIS — R2681 Unsteadiness on feet: Secondary | ICD-10-CM | POA: Diagnosis not present

## 2018-11-07 DIAGNOSIS — M542 Cervicalgia: Secondary | ICD-10-CM | POA: Diagnosis not present

## 2018-11-07 DIAGNOSIS — E1151 Type 2 diabetes mellitus with diabetic peripheral angiopathy without gangrene: Secondary | ICD-10-CM | POA: Diagnosis not present

## 2018-11-07 DIAGNOSIS — R2689 Other abnormalities of gait and mobility: Secondary | ICD-10-CM | POA: Diagnosis not present

## 2018-11-07 DIAGNOSIS — R531 Weakness: Secondary | ICD-10-CM | POA: Diagnosis not present

## 2018-11-07 DIAGNOSIS — E1140 Type 2 diabetes mellitus with diabetic neuropathy, unspecified: Secondary | ICD-10-CM | POA: Diagnosis not present

## 2018-11-07 DIAGNOSIS — R296 Repeated falls: Secondary | ICD-10-CM | POA: Diagnosis not present

## 2018-11-08 DIAGNOSIS — R531 Weakness: Secondary | ICD-10-CM | POA: Diagnosis not present

## 2018-11-08 DIAGNOSIS — R296 Repeated falls: Secondary | ICD-10-CM | POA: Diagnosis not present

## 2018-11-08 DIAGNOSIS — R2681 Unsteadiness on feet: Secondary | ICD-10-CM | POA: Diagnosis not present

## 2018-11-08 DIAGNOSIS — R2689 Other abnormalities of gait and mobility: Secondary | ICD-10-CM | POA: Diagnosis not present

## 2018-11-08 DIAGNOSIS — M542 Cervicalgia: Secondary | ICD-10-CM | POA: Diagnosis not present

## 2018-11-10 DIAGNOSIS — R2681 Unsteadiness on feet: Secondary | ICD-10-CM | POA: Diagnosis not present

## 2018-11-10 DIAGNOSIS — R2689 Other abnormalities of gait and mobility: Secondary | ICD-10-CM | POA: Diagnosis not present

## 2018-11-10 DIAGNOSIS — M542 Cervicalgia: Secondary | ICD-10-CM | POA: Diagnosis not present

## 2018-11-10 DIAGNOSIS — R296 Repeated falls: Secondary | ICD-10-CM | POA: Diagnosis not present

## 2018-11-10 DIAGNOSIS — R531 Weakness: Secondary | ICD-10-CM | POA: Diagnosis not present

## 2018-11-14 DIAGNOSIS — R531 Weakness: Secondary | ICD-10-CM | POA: Diagnosis not present

## 2018-11-14 DIAGNOSIS — R2689 Other abnormalities of gait and mobility: Secondary | ICD-10-CM | POA: Diagnosis not present

## 2018-11-14 DIAGNOSIS — M542 Cervicalgia: Secondary | ICD-10-CM | POA: Diagnosis not present

## 2018-11-14 DIAGNOSIS — R2681 Unsteadiness on feet: Secondary | ICD-10-CM | POA: Diagnosis not present

## 2018-11-14 DIAGNOSIS — R296 Repeated falls: Secondary | ICD-10-CM | POA: Diagnosis not present

## 2018-11-16 DIAGNOSIS — R531 Weakness: Secondary | ICD-10-CM | POA: Diagnosis not present

## 2018-11-16 DIAGNOSIS — R2681 Unsteadiness on feet: Secondary | ICD-10-CM | POA: Diagnosis not present

## 2018-11-16 DIAGNOSIS — F423 Hoarding disorder: Secondary | ICD-10-CM | POA: Diagnosis not present

## 2018-11-16 DIAGNOSIS — R2689 Other abnormalities of gait and mobility: Secondary | ICD-10-CM | POA: Diagnosis not present

## 2018-11-16 DIAGNOSIS — M542 Cervicalgia: Secondary | ICD-10-CM | POA: Diagnosis not present

## 2018-11-16 DIAGNOSIS — R296 Repeated falls: Secondary | ICD-10-CM | POA: Diagnosis not present

## 2018-11-22 DIAGNOSIS — R2681 Unsteadiness on feet: Secondary | ICD-10-CM | POA: Diagnosis not present

## 2018-11-22 DIAGNOSIS — R531 Weakness: Secondary | ICD-10-CM | POA: Diagnosis not present

## 2018-11-22 DIAGNOSIS — R002 Palpitations: Secondary | ICD-10-CM | POA: Diagnosis not present

## 2018-11-22 DIAGNOSIS — M542 Cervicalgia: Secondary | ICD-10-CM | POA: Diagnosis not present

## 2018-11-22 DIAGNOSIS — I10 Essential (primary) hypertension: Secondary | ICD-10-CM | POA: Diagnosis not present

## 2018-11-22 DIAGNOSIS — R296 Repeated falls: Secondary | ICD-10-CM | POA: Diagnosis not present

## 2018-11-22 DIAGNOSIS — I951 Orthostatic hypotension: Secondary | ICD-10-CM | POA: Diagnosis not present

## 2018-11-22 DIAGNOSIS — R4182 Altered mental status, unspecified: Secondary | ICD-10-CM | POA: Diagnosis not present

## 2018-11-22 DIAGNOSIS — I2510 Atherosclerotic heart disease of native coronary artery without angina pectoris: Secondary | ICD-10-CM | POA: Diagnosis not present

## 2018-11-22 DIAGNOSIS — R2689 Other abnormalities of gait and mobility: Secondary | ICD-10-CM | POA: Diagnosis not present

## 2018-11-23 DIAGNOSIS — R296 Repeated falls: Secondary | ICD-10-CM | POA: Diagnosis not present

## 2018-11-23 DIAGNOSIS — M542 Cervicalgia: Secondary | ICD-10-CM | POA: Diagnosis not present

## 2018-11-23 DIAGNOSIS — R2689 Other abnormalities of gait and mobility: Secondary | ICD-10-CM | POA: Diagnosis not present

## 2018-11-23 DIAGNOSIS — R531 Weakness: Secondary | ICD-10-CM | POA: Diagnosis not present

## 2018-11-23 DIAGNOSIS — R2681 Unsteadiness on feet: Secondary | ICD-10-CM | POA: Diagnosis not present

## 2018-11-25 DIAGNOSIS — R531 Weakness: Secondary | ICD-10-CM | POA: Diagnosis not present

## 2018-11-25 DIAGNOSIS — R296 Repeated falls: Secondary | ICD-10-CM | POA: Diagnosis not present

## 2018-11-25 DIAGNOSIS — R2681 Unsteadiness on feet: Secondary | ICD-10-CM | POA: Diagnosis not present

## 2018-11-25 DIAGNOSIS — M542 Cervicalgia: Secondary | ICD-10-CM | POA: Diagnosis not present

## 2018-11-25 DIAGNOSIS — R2689 Other abnormalities of gait and mobility: Secondary | ICD-10-CM | POA: Diagnosis not present

## 2018-11-28 DIAGNOSIS — R531 Weakness: Secondary | ICD-10-CM | POA: Diagnosis not present

## 2018-11-28 DIAGNOSIS — R2689 Other abnormalities of gait and mobility: Secondary | ICD-10-CM | POA: Diagnosis not present

## 2018-11-28 DIAGNOSIS — M542 Cervicalgia: Secondary | ICD-10-CM | POA: Diagnosis not present

## 2018-11-28 DIAGNOSIS — R296 Repeated falls: Secondary | ICD-10-CM | POA: Diagnosis not present

## 2018-11-28 DIAGNOSIS — R2681 Unsteadiness on feet: Secondary | ICD-10-CM | POA: Diagnosis not present

## 2018-12-07 DIAGNOSIS — F423 Hoarding disorder: Secondary | ICD-10-CM | POA: Diagnosis not present

## 2018-12-13 DIAGNOSIS — Z299 Encounter for prophylactic measures, unspecified: Secondary | ICD-10-CM | POA: Diagnosis not present

## 2018-12-13 DIAGNOSIS — E1165 Type 2 diabetes mellitus with hyperglycemia: Secondary | ICD-10-CM | POA: Diagnosis not present

## 2018-12-13 DIAGNOSIS — I10 Essential (primary) hypertension: Secondary | ICD-10-CM | POA: Diagnosis not present

## 2018-12-13 DIAGNOSIS — Z6834 Body mass index (BMI) 34.0-34.9, adult: Secondary | ICD-10-CM | POA: Diagnosis not present

## 2018-12-13 DIAGNOSIS — I2510 Atherosclerotic heart disease of native coronary artery without angina pectoris: Secondary | ICD-10-CM | POA: Diagnosis not present

## 2018-12-27 DIAGNOSIS — F423 Hoarding disorder: Secondary | ICD-10-CM | POA: Diagnosis not present

## 2019-01-05 DIAGNOSIS — Z87440 Personal history of urinary (tract) infections: Secondary | ICD-10-CM | POA: Diagnosis not present

## 2019-01-05 DIAGNOSIS — R350 Frequency of micturition: Secondary | ICD-10-CM | POA: Diagnosis not present

## 2019-01-05 DIAGNOSIS — N528 Other male erectile dysfunction: Secondary | ICD-10-CM | POA: Diagnosis not present

## 2019-01-05 DIAGNOSIS — N401 Enlarged prostate with lower urinary tract symptoms: Secondary | ICD-10-CM | POA: Diagnosis not present

## 2019-01-05 DIAGNOSIS — R3914 Feeling of incomplete bladder emptying: Secondary | ICD-10-CM | POA: Diagnosis not present

## 2019-01-05 DIAGNOSIS — R1032 Left lower quadrant pain: Secondary | ICD-10-CM | POA: Diagnosis not present

## 2019-01-30 DIAGNOSIS — E1140 Type 2 diabetes mellitus with diabetic neuropathy, unspecified: Secondary | ICD-10-CM | POA: Diagnosis not present

## 2019-01-30 DIAGNOSIS — E1151 Type 2 diabetes mellitus with diabetic peripheral angiopathy without gangrene: Secondary | ICD-10-CM | POA: Diagnosis not present

## 2019-02-07 DIAGNOSIS — M542 Cervicalgia: Secondary | ICD-10-CM | POA: Diagnosis not present

## 2019-02-07 DIAGNOSIS — Z299 Encounter for prophylactic measures, unspecified: Secondary | ICD-10-CM | POA: Diagnosis not present

## 2019-02-07 DIAGNOSIS — I10 Essential (primary) hypertension: Secondary | ICD-10-CM | POA: Diagnosis not present

## 2019-02-07 DIAGNOSIS — M2578 Osteophyte, vertebrae: Secondary | ICD-10-CM | POA: Diagnosis not present

## 2019-02-07 DIAGNOSIS — Z23 Encounter for immunization: Secondary | ICD-10-CM | POA: Diagnosis not present

## 2019-02-07 DIAGNOSIS — E1165 Type 2 diabetes mellitus with hyperglycemia: Secondary | ICD-10-CM | POA: Diagnosis not present

## 2019-02-07 DIAGNOSIS — Z6836 Body mass index (BMI) 36.0-36.9, adult: Secondary | ICD-10-CM | POA: Diagnosis not present

## 2019-02-07 DIAGNOSIS — G4733 Obstructive sleep apnea (adult) (pediatric): Secondary | ICD-10-CM | POA: Diagnosis not present

## 2019-02-15 DIAGNOSIS — M5416 Radiculopathy, lumbar region: Secondary | ICD-10-CM | POA: Diagnosis not present

## 2019-02-22 DIAGNOSIS — R1110 Vomiting, unspecified: Secondary | ICD-10-CM | POA: Diagnosis not present

## 2019-02-22 DIAGNOSIS — Z20828 Contact with and (suspected) exposure to other viral communicable diseases: Secondary | ICD-10-CM | POA: Diagnosis not present

## 2019-02-22 DIAGNOSIS — Z299 Encounter for prophylactic measures, unspecified: Secondary | ICD-10-CM | POA: Diagnosis not present

## 2019-02-22 DIAGNOSIS — Z6837 Body mass index (BMI) 37.0-37.9, adult: Secondary | ICD-10-CM | POA: Diagnosis not present

## 2019-02-22 DIAGNOSIS — I10 Essential (primary) hypertension: Secondary | ICD-10-CM | POA: Diagnosis not present

## 2019-02-22 DIAGNOSIS — Z713 Dietary counseling and surveillance: Secondary | ICD-10-CM | POA: Diagnosis not present

## 2019-03-02 DIAGNOSIS — Z6834 Body mass index (BMI) 34.0-34.9, adult: Secondary | ICD-10-CM | POA: Diagnosis not present

## 2019-03-02 DIAGNOSIS — E1165 Type 2 diabetes mellitus with hyperglycemia: Secondary | ICD-10-CM | POA: Diagnosis not present

## 2019-03-02 DIAGNOSIS — R1013 Epigastric pain: Secondary | ICD-10-CM | POA: Diagnosis not present

## 2019-03-02 DIAGNOSIS — I10 Essential (primary) hypertension: Secondary | ICD-10-CM | POA: Diagnosis not present

## 2019-03-02 DIAGNOSIS — Z299 Encounter for prophylactic measures, unspecified: Secondary | ICD-10-CM | POA: Diagnosis not present

## 2019-03-03 DIAGNOSIS — R1013 Epigastric pain: Secondary | ICD-10-CM | POA: Diagnosis not present

## 2019-03-03 DIAGNOSIS — I700 Atherosclerosis of aorta: Secondary | ICD-10-CM | POA: Diagnosis not present

## 2019-03-03 DIAGNOSIS — N200 Calculus of kidney: Secondary | ICD-10-CM | POA: Diagnosis not present

## 2019-03-03 DIAGNOSIS — R1110 Vomiting, unspecified: Secondary | ICD-10-CM | POA: Diagnosis not present

## 2019-03-03 DIAGNOSIS — R1112 Projectile vomiting: Secondary | ICD-10-CM | POA: Diagnosis not present

## 2019-05-05 DIAGNOSIS — Z0000 Encounter for general adult medical examination without abnormal findings: Secondary | ICD-10-CM | POA: Diagnosis not present

## 2019-05-05 DIAGNOSIS — Z6834 Body mass index (BMI) 34.0-34.9, adult: Secondary | ICD-10-CM | POA: Diagnosis not present

## 2019-05-05 DIAGNOSIS — Z1331 Encounter for screening for depression: Secondary | ICD-10-CM | POA: Diagnosis not present

## 2019-05-05 DIAGNOSIS — R5383 Other fatigue: Secondary | ICD-10-CM | POA: Diagnosis not present

## 2019-05-05 DIAGNOSIS — E1165 Type 2 diabetes mellitus with hyperglycemia: Secondary | ICD-10-CM | POA: Diagnosis not present

## 2019-05-05 DIAGNOSIS — Z7189 Other specified counseling: Secondary | ICD-10-CM | POA: Diagnosis not present

## 2019-05-05 DIAGNOSIS — Z125 Encounter for screening for malignant neoplasm of prostate: Secondary | ICD-10-CM | POA: Diagnosis not present

## 2019-05-05 DIAGNOSIS — Z79899 Other long term (current) drug therapy: Secondary | ICD-10-CM | POA: Diagnosis not present

## 2019-05-05 DIAGNOSIS — I10 Essential (primary) hypertension: Secondary | ICD-10-CM | POA: Diagnosis not present

## 2019-05-05 DIAGNOSIS — E7800 Pure hypercholesterolemia, unspecified: Secondary | ICD-10-CM | POA: Diagnosis not present

## 2019-05-05 DIAGNOSIS — Z299 Encounter for prophylactic measures, unspecified: Secondary | ICD-10-CM | POA: Diagnosis not present

## 2019-05-05 DIAGNOSIS — Z1211 Encounter for screening for malignant neoplasm of colon: Secondary | ICD-10-CM | POA: Diagnosis not present

## 2019-05-05 DIAGNOSIS — Z1339 Encounter for screening examination for other mental health and behavioral disorders: Secondary | ICD-10-CM | POA: Diagnosis not present

## 2019-05-11 DIAGNOSIS — T189XXA Foreign body of alimentary tract, part unspecified, initial encounter: Secondary | ICD-10-CM | POA: Diagnosis not present

## 2019-05-11 DIAGNOSIS — N200 Calculus of kidney: Secondary | ICD-10-CM | POA: Diagnosis not present

## 2019-05-30 DIAGNOSIS — G4730 Sleep apnea, unspecified: Secondary | ICD-10-CM | POA: Diagnosis not present

## 2019-05-30 DIAGNOSIS — I10 Essential (primary) hypertension: Secondary | ICD-10-CM | POA: Diagnosis not present

## 2019-05-30 DIAGNOSIS — R55 Syncope and collapse: Secondary | ICD-10-CM | POA: Diagnosis not present

## 2019-05-30 DIAGNOSIS — R4182 Altered mental status, unspecified: Secondary | ICD-10-CM | POA: Diagnosis not present

## 2019-05-30 DIAGNOSIS — E119 Type 2 diabetes mellitus without complications: Secondary | ICD-10-CM | POA: Diagnosis not present

## 2019-05-30 DIAGNOSIS — R002 Palpitations: Secondary | ICD-10-CM | POA: Diagnosis not present

## 2019-05-30 DIAGNOSIS — I2510 Atherosclerotic heart disease of native coronary artery without angina pectoris: Secondary | ICD-10-CM | POA: Diagnosis not present

## 2019-06-02 DIAGNOSIS — Z23 Encounter for immunization: Secondary | ICD-10-CM | POA: Diagnosis not present

## 2019-06-22 DIAGNOSIS — R81 Glycosuria: Secondary | ICD-10-CM | POA: Diagnosis not present

## 2019-06-22 DIAGNOSIS — N200 Calculus of kidney: Secondary | ICD-10-CM | POA: Diagnosis not present

## 2019-06-22 DIAGNOSIS — Z87440 Personal history of urinary (tract) infections: Secondary | ICD-10-CM | POA: Diagnosis not present

## 2019-06-22 DIAGNOSIS — N528 Other male erectile dysfunction: Secondary | ICD-10-CM | POA: Diagnosis not present

## 2019-06-22 DIAGNOSIS — R350 Frequency of micturition: Secondary | ICD-10-CM | POA: Diagnosis not present

## 2019-06-22 DIAGNOSIS — N401 Enlarged prostate with lower urinary tract symptoms: Secondary | ICD-10-CM | POA: Diagnosis not present

## 2019-06-22 DIAGNOSIS — R351 Nocturia: Secondary | ICD-10-CM | POA: Diagnosis not present

## 2019-06-22 DIAGNOSIS — R3912 Poor urinary stream: Secondary | ICD-10-CM | POA: Diagnosis not present

## 2019-06-26 DIAGNOSIS — E1140 Type 2 diabetes mellitus with diabetic neuropathy, unspecified: Secondary | ICD-10-CM | POA: Diagnosis not present

## 2019-06-26 DIAGNOSIS — E1151 Type 2 diabetes mellitus with diabetic peripheral angiopathy without gangrene: Secondary | ICD-10-CM | POA: Diagnosis not present

## 2019-06-27 DIAGNOSIS — I10 Essential (primary) hypertension: Secondary | ICD-10-CM | POA: Diagnosis not present

## 2019-06-27 DIAGNOSIS — J029 Acute pharyngitis, unspecified: Secondary | ICD-10-CM | POA: Diagnosis not present

## 2019-06-27 DIAGNOSIS — R413 Other amnesia: Secondary | ICD-10-CM | POA: Diagnosis not present

## 2019-06-27 DIAGNOSIS — E1165 Type 2 diabetes mellitus with hyperglycemia: Secondary | ICD-10-CM | POA: Diagnosis not present

## 2019-06-27 DIAGNOSIS — Z299 Encounter for prophylactic measures, unspecified: Secondary | ICD-10-CM | POA: Diagnosis not present

## 2019-06-27 DIAGNOSIS — Z87891 Personal history of nicotine dependence: Secondary | ICD-10-CM | POA: Diagnosis not present

## 2019-06-30 DIAGNOSIS — Z23 Encounter for immunization: Secondary | ICD-10-CM | POA: Diagnosis not present

## 2019-07-03 DIAGNOSIS — H04123 Dry eye syndrome of bilateral lacrimal glands: Secondary | ICD-10-CM | POA: Diagnosis not present

## 2019-07-03 DIAGNOSIS — H524 Presbyopia: Secondary | ICD-10-CM | POA: Diagnosis not present

## 2019-07-03 DIAGNOSIS — H35373 Puckering of macula, bilateral: Secondary | ICD-10-CM | POA: Diagnosis not present

## 2019-07-03 DIAGNOSIS — E119 Type 2 diabetes mellitus without complications: Secondary | ICD-10-CM | POA: Diagnosis not present

## 2019-07-03 DIAGNOSIS — H3540 Unspecified peripheral retinal degeneration: Secondary | ICD-10-CM | POA: Diagnosis not present

## 2019-07-05 DIAGNOSIS — T189XXA Foreign body of alimentary tract, part unspecified, initial encounter: Secondary | ICD-10-CM | POA: Diagnosis not present

## 2019-07-05 DIAGNOSIS — R413 Other amnesia: Secondary | ICD-10-CM | POA: Diagnosis not present

## 2019-07-11 DIAGNOSIS — Z85828 Personal history of other malignant neoplasm of skin: Secondary | ICD-10-CM | POA: Diagnosis not present

## 2019-07-11 DIAGNOSIS — D485 Neoplasm of uncertain behavior of skin: Secondary | ICD-10-CM | POA: Diagnosis not present

## 2019-07-11 DIAGNOSIS — L570 Actinic keratosis: Secondary | ICD-10-CM | POA: Diagnosis not present

## 2019-07-11 DIAGNOSIS — C44629 Squamous cell carcinoma of skin of left upper limb, including shoulder: Secondary | ICD-10-CM | POA: Diagnosis not present

## 2019-07-11 DIAGNOSIS — L209 Atopic dermatitis, unspecified: Secondary | ICD-10-CM | POA: Diagnosis not present

## 2019-07-11 DIAGNOSIS — L821 Other seborrheic keratosis: Secondary | ICD-10-CM | POA: Diagnosis not present

## 2019-07-18 DIAGNOSIS — C44629 Squamous cell carcinoma of skin of left upper limb, including shoulder: Secondary | ICD-10-CM | POA: Diagnosis not present

## 2019-08-01 DIAGNOSIS — L570 Actinic keratosis: Secondary | ICD-10-CM | POA: Diagnosis not present

## 2019-08-01 DIAGNOSIS — C44629 Squamous cell carcinoma of skin of left upper limb, including shoulder: Secondary | ICD-10-CM | POA: Diagnosis not present

## 2019-08-02 DIAGNOSIS — E1165 Type 2 diabetes mellitus with hyperglycemia: Secondary | ICD-10-CM | POA: Diagnosis not present

## 2019-08-02 DIAGNOSIS — Z299 Encounter for prophylactic measures, unspecified: Secondary | ICD-10-CM | POA: Diagnosis not present

## 2019-08-02 DIAGNOSIS — E7800 Pure hypercholesterolemia, unspecified: Secondary | ICD-10-CM | POA: Diagnosis not present

## 2019-08-02 DIAGNOSIS — R9082 White matter disease, unspecified: Secondary | ICD-10-CM | POA: Diagnosis not present

## 2019-08-02 DIAGNOSIS — I10 Essential (primary) hypertension: Secondary | ICD-10-CM | POA: Diagnosis not present

## 2019-08-02 DIAGNOSIS — I998 Other disorder of circulatory system: Secondary | ICD-10-CM | POA: Diagnosis not present

## 2019-08-02 DIAGNOSIS — R6889 Other general symptoms and signs: Secondary | ICD-10-CM | POA: Diagnosis not present

## 2019-08-08 DIAGNOSIS — S0101XA Laceration without foreign body of scalp, initial encounter: Secondary | ICD-10-CM | POA: Diagnosis not present

## 2019-08-08 DIAGNOSIS — M19022 Primary osteoarthritis, left elbow: Secondary | ICD-10-CM | POA: Diagnosis not present

## 2019-08-08 DIAGNOSIS — S199XXA Unspecified injury of neck, initial encounter: Secondary | ICD-10-CM | POA: Diagnosis not present

## 2019-08-08 DIAGNOSIS — S0990XA Unspecified injury of head, initial encounter: Secondary | ICD-10-CM | POA: Diagnosis not present

## 2019-08-08 DIAGNOSIS — M5134 Other intervertebral disc degeneration, thoracic region: Secondary | ICD-10-CM | POA: Diagnosis not present

## 2019-08-08 DIAGNOSIS — S50312A Abrasion of left elbow, initial encounter: Secondary | ICD-10-CM | POA: Diagnosis not present

## 2019-08-08 DIAGNOSIS — S22079A Unspecified fracture of T9-T10 vertebra, initial encounter for closed fracture: Secondary | ICD-10-CM | POA: Diagnosis not present

## 2019-08-08 DIAGNOSIS — M47812 Spondylosis without myelopathy or radiculopathy, cervical region: Secondary | ICD-10-CM | POA: Diagnosis not present

## 2019-08-08 DIAGNOSIS — I10 Essential (primary) hypertension: Secondary | ICD-10-CM | POA: Diagnosis not present

## 2019-08-08 DIAGNOSIS — R519 Headache, unspecified: Secondary | ICD-10-CM | POA: Diagnosis not present

## 2019-08-08 DIAGNOSIS — S22071A Stable burst fracture of T9-T10 vertebra, initial encounter for closed fracture: Secondary | ICD-10-CM | POA: Diagnosis not present

## 2019-08-08 DIAGNOSIS — S2242XA Multiple fractures of ribs, left side, initial encounter for closed fracture: Secondary | ICD-10-CM | POA: Diagnosis not present

## 2019-08-08 DIAGNOSIS — I2510 Atherosclerotic heart disease of native coronary artery without angina pectoris: Secondary | ICD-10-CM | POA: Diagnosis not present

## 2019-08-08 DIAGNOSIS — M47816 Spondylosis without myelopathy or radiculopathy, lumbar region: Secondary | ICD-10-CM | POA: Diagnosis not present

## 2019-08-08 DIAGNOSIS — E119 Type 2 diabetes mellitus without complications: Secondary | ICD-10-CM | POA: Diagnosis not present

## 2019-08-10 DIAGNOSIS — I2510 Atherosclerotic heart disease of native coronary artery without angina pectoris: Secondary | ICD-10-CM | POA: Diagnosis not present

## 2019-08-10 DIAGNOSIS — Z299 Encounter for prophylactic measures, unspecified: Secondary | ICD-10-CM | POA: Diagnosis not present

## 2019-08-10 DIAGNOSIS — I10 Essential (primary) hypertension: Secondary | ICD-10-CM | POA: Diagnosis not present

## 2019-08-10 DIAGNOSIS — S22009A Unspecified fracture of unspecified thoracic vertebra, initial encounter for closed fracture: Secondary | ICD-10-CM | POA: Diagnosis not present

## 2019-08-10 DIAGNOSIS — S2232XA Fracture of one rib, left side, initial encounter for closed fracture: Secondary | ICD-10-CM | POA: Diagnosis not present

## 2019-08-15 DIAGNOSIS — M8440XA Pathological fracture, unspecified site, initial encounter for fracture: Secondary | ICD-10-CM | POA: Diagnosis not present

## 2019-08-18 DIAGNOSIS — F329 Major depressive disorder, single episode, unspecified: Secondary | ICD-10-CM | POA: Diagnosis not present

## 2019-08-18 DIAGNOSIS — I10 Essential (primary) hypertension: Secondary | ICD-10-CM | POA: Diagnosis not present

## 2019-09-06 DIAGNOSIS — M1811 Unilateral primary osteoarthritis of first carpometacarpal joint, right hand: Secondary | ICD-10-CM | POA: Diagnosis not present

## 2019-09-06 DIAGNOSIS — M65331 Trigger finger, right middle finger: Secondary | ICD-10-CM | POA: Diagnosis not present

## 2019-09-08 ENCOUNTER — Other Ambulatory Visit: Payer: Self-pay | Admitting: Orthopedic Surgery

## 2019-09-08 DIAGNOSIS — M25531 Pain in right wrist: Secondary | ICD-10-CM

## 2019-09-11 DIAGNOSIS — E1151 Type 2 diabetes mellitus with diabetic peripheral angiopathy without gangrene: Secondary | ICD-10-CM | POA: Diagnosis not present

## 2019-09-11 DIAGNOSIS — E1140 Type 2 diabetes mellitus with diabetic neuropathy, unspecified: Secondary | ICD-10-CM | POA: Diagnosis not present

## 2019-09-18 DIAGNOSIS — M549 Dorsalgia, unspecified: Secondary | ICD-10-CM | POA: Diagnosis not present

## 2019-09-18 DIAGNOSIS — I10 Essential (primary) hypertension: Secondary | ICD-10-CM | POA: Diagnosis not present

## 2019-09-29 DIAGNOSIS — E1165 Type 2 diabetes mellitus with hyperglycemia: Secondary | ICD-10-CM | POA: Diagnosis not present

## 2019-09-29 DIAGNOSIS — I10 Essential (primary) hypertension: Secondary | ICD-10-CM | POA: Diagnosis not present

## 2019-09-29 DIAGNOSIS — I2510 Atherosclerotic heart disease of native coronary artery without angina pectoris: Secondary | ICD-10-CM | POA: Diagnosis not present

## 2019-09-29 DIAGNOSIS — Z299 Encounter for prophylactic measures, unspecified: Secondary | ICD-10-CM | POA: Diagnosis not present

## 2019-09-29 DIAGNOSIS — G4733 Obstructive sleep apnea (adult) (pediatric): Secondary | ICD-10-CM | POA: Diagnosis not present

## 2019-10-04 DIAGNOSIS — R413 Other amnesia: Secondary | ICD-10-CM | POA: Diagnosis not present

## 2019-10-12 ENCOUNTER — Ambulatory Visit
Admission: RE | Admit: 2019-10-12 | Discharge: 2019-10-12 | Disposition: A | Discharge status: Home / Self Care | Payer: Medicare Other | Source: Ambulatory Visit | Attending: Orthopedic Surgery | Admitting: Orthopedic Surgery

## 2019-10-12 DIAGNOSIS — M25531 Pain in right wrist: Secondary | ICD-10-CM

## 2019-10-12 MED ORDER — GADOBENATE DIMEGLUMINE 529 MG/ML IV SOLN
18.0000 mL | Freq: Once | INTRAVENOUS | Status: AC | PRN
  Administered 2019-10-12: 18 mL via INTRAVENOUS

## 2019-10-27 DIAGNOSIS — M47816 Spondylosis without myelopathy or radiculopathy, lumbar region: Secondary | ICD-10-CM | POA: Diagnosis not present

## 2019-10-27 DIAGNOSIS — M47817 Spondylosis without myelopathy or radiculopathy, lumbosacral region: Secondary | ICD-10-CM | POA: Diagnosis not present

## 2019-10-27 DIAGNOSIS — M47815 Spondylosis without myelopathy or radiculopathy, thoracolumbar region: Secondary | ICD-10-CM | POA: Diagnosis not present

## 2019-11-01 DIAGNOSIS — I10 Essential (primary) hypertension: Secondary | ICD-10-CM | POA: Diagnosis not present

## 2019-11-01 DIAGNOSIS — Z299 Encounter for prophylactic measures, unspecified: Secondary | ICD-10-CM | POA: Diagnosis not present

## 2019-11-01 DIAGNOSIS — I998 Other disorder of circulatory system: Secondary | ICD-10-CM | POA: Diagnosis not present

## 2019-11-01 DIAGNOSIS — I2510 Atherosclerotic heart disease of native coronary artery without angina pectoris: Secondary | ICD-10-CM | POA: Diagnosis not present

## 2019-11-01 DIAGNOSIS — R9082 White matter disease, unspecified: Secondary | ICD-10-CM | POA: Diagnosis not present

## 2019-11-01 DIAGNOSIS — Z6834 Body mass index (BMI) 34.0-34.9, adult: Secondary | ICD-10-CM | POA: Diagnosis not present

## 2019-11-01 DIAGNOSIS — E1165 Type 2 diabetes mellitus with hyperglycemia: Secondary | ICD-10-CM | POA: Diagnosis not present

## 2019-11-07 DIAGNOSIS — Z299 Encounter for prophylactic measures, unspecified: Secondary | ICD-10-CM | POA: Diagnosis not present

## 2019-11-07 DIAGNOSIS — G4733 Obstructive sleep apnea (adult) (pediatric): Secondary | ICD-10-CM | POA: Diagnosis not present

## 2019-11-07 DIAGNOSIS — M549 Dorsalgia, unspecified: Secondary | ICD-10-CM | POA: Diagnosis not present

## 2019-11-07 DIAGNOSIS — I10 Essential (primary) hypertension: Secondary | ICD-10-CM | POA: Diagnosis not present

## 2019-11-08 DIAGNOSIS — D692 Other nonthrombocytopenic purpura: Secondary | ICD-10-CM | POA: Diagnosis not present

## 2019-11-08 DIAGNOSIS — L821 Other seborrheic keratosis: Secondary | ICD-10-CM | POA: Diagnosis not present

## 2019-11-08 DIAGNOSIS — L570 Actinic keratosis: Secondary | ICD-10-CM | POA: Diagnosis not present

## 2019-11-08 DIAGNOSIS — L820 Inflamed seborrheic keratosis: Secondary | ICD-10-CM | POA: Diagnosis not present

## 2019-11-08 DIAGNOSIS — Z85828 Personal history of other malignant neoplasm of skin: Secondary | ICD-10-CM | POA: Diagnosis not present

## 2019-11-17 DIAGNOSIS — E1165 Type 2 diabetes mellitus with hyperglycemia: Secondary | ICD-10-CM | POA: Diagnosis not present

## 2019-11-17 DIAGNOSIS — K219 Gastro-esophageal reflux disease without esophagitis: Secondary | ICD-10-CM | POA: Diagnosis not present

## 2019-11-17 DIAGNOSIS — E799 Disorder of purine and pyrimidine metabolism, unspecified: Secondary | ICD-10-CM | POA: Diagnosis not present

## 2019-11-17 DIAGNOSIS — I10 Essential (primary) hypertension: Secondary | ICD-10-CM | POA: Diagnosis not present

## 2019-11-28 DIAGNOSIS — I2510 Atherosclerotic heart disease of native coronary artery without angina pectoris: Secondary | ICD-10-CM | POA: Diagnosis not present

## 2019-11-28 DIAGNOSIS — R55 Syncope and collapse: Secondary | ICD-10-CM | POA: Diagnosis not present

## 2019-11-28 DIAGNOSIS — I10 Essential (primary) hypertension: Secondary | ICD-10-CM | POA: Diagnosis not present

## 2019-11-28 DIAGNOSIS — E119 Type 2 diabetes mellitus without complications: Secondary | ICD-10-CM | POA: Diagnosis not present

## 2019-11-28 DIAGNOSIS — G4730 Sleep apnea, unspecified: Secondary | ICD-10-CM | POA: Diagnosis not present

## 2019-11-28 DIAGNOSIS — R002 Palpitations: Secondary | ICD-10-CM | POA: Diagnosis not present

## 2019-12-04 DIAGNOSIS — E1140 Type 2 diabetes mellitus with diabetic neuropathy, unspecified: Secondary | ICD-10-CM | POA: Diagnosis not present

## 2019-12-04 DIAGNOSIS — E1151 Type 2 diabetes mellitus with diabetic peripheral angiopathy without gangrene: Secondary | ICD-10-CM | POA: Diagnosis not present

## 2019-12-19 DIAGNOSIS — E7849 Other hyperlipidemia: Secondary | ICD-10-CM | POA: Diagnosis not present

## 2019-12-19 DIAGNOSIS — K219 Gastro-esophageal reflux disease without esophagitis: Secondary | ICD-10-CM | POA: Diagnosis not present

## 2019-12-19 DIAGNOSIS — E1165 Type 2 diabetes mellitus with hyperglycemia: Secondary | ICD-10-CM | POA: Diagnosis not present

## 2019-12-19 DIAGNOSIS — I10 Essential (primary) hypertension: Secondary | ICD-10-CM | POA: Diagnosis not present

## 2019-12-22 DIAGNOSIS — S22078D Other fracture of T9-T10 vertebra, subsequent encounter for fracture with routine healing: Secondary | ICD-10-CM | POA: Diagnosis not present

## 2019-12-22 DIAGNOSIS — S52502A Unspecified fracture of the lower end of left radius, initial encounter for closed fracture: Secondary | ICD-10-CM | POA: Diagnosis not present

## 2019-12-22 DIAGNOSIS — I340 Nonrheumatic mitral (valve) insufficiency: Secondary | ICD-10-CM | POA: Diagnosis not present

## 2019-12-22 DIAGNOSIS — M4807 Spinal stenosis, lumbosacral region: Secondary | ICD-10-CM | POA: Diagnosis not present

## 2019-12-22 DIAGNOSIS — S52602A Unspecified fracture of lower end of left ulna, initial encounter for closed fracture: Secondary | ICD-10-CM | POA: Diagnosis not present

## 2019-12-22 DIAGNOSIS — F419 Anxiety disorder, unspecified: Secondary | ICD-10-CM | POA: Diagnosis not present

## 2019-12-22 DIAGNOSIS — R0689 Other abnormalities of breathing: Secondary | ICD-10-CM | POA: Diagnosis not present

## 2019-12-22 DIAGNOSIS — R262 Difficulty in walking, not elsewhere classified: Secondary | ICD-10-CM | POA: Diagnosis not present

## 2019-12-22 DIAGNOSIS — R0902 Hypoxemia: Secondary | ICD-10-CM | POA: Diagnosis not present

## 2019-12-22 DIAGNOSIS — S22078A Other fracture of T9-T10 vertebra, initial encounter for closed fracture: Secondary | ICD-10-CM | POA: Diagnosis not present

## 2019-12-22 DIAGNOSIS — W309XXA Contact with unspecified agricultural machinery, initial encounter: Secondary | ICD-10-CM | POA: Diagnosis not present

## 2019-12-22 DIAGNOSIS — F17210 Nicotine dependence, cigarettes, uncomplicated: Secondary | ICD-10-CM | POA: Diagnosis not present

## 2019-12-22 DIAGNOSIS — M6281 Muscle weakness (generalized): Secondary | ICD-10-CM | POA: Diagnosis not present

## 2019-12-22 DIAGNOSIS — R0789 Other chest pain: Secondary | ICD-10-CM | POA: Diagnosis not present

## 2019-12-22 DIAGNOSIS — E785 Hyperlipidemia, unspecified: Secondary | ICD-10-CM | POA: Diagnosis present

## 2019-12-22 DIAGNOSIS — Z20822 Contact with and (suspected) exposure to covid-19: Secondary | ICD-10-CM | POA: Diagnosis present

## 2019-12-22 DIAGNOSIS — Z794 Long term (current) use of insulin: Secondary | ICD-10-CM | POA: Diagnosis not present

## 2019-12-22 DIAGNOSIS — R109 Unspecified abdominal pain: Secondary | ICD-10-CM | POA: Diagnosis not present

## 2019-12-22 DIAGNOSIS — Z87891 Personal history of nicotine dependence: Secondary | ICD-10-CM | POA: Diagnosis not present

## 2019-12-22 DIAGNOSIS — T797XXA Traumatic subcutaneous emphysema, initial encounter: Secondary | ICD-10-CM | POA: Diagnosis not present

## 2019-12-22 DIAGNOSIS — F411 Generalized anxiety disorder: Secondary | ICD-10-CM | POA: Diagnosis present

## 2019-12-22 DIAGNOSIS — S52612A Displaced fracture of left ulna styloid process, initial encounter for closed fracture: Secondary | ICD-10-CM | POA: Diagnosis not present

## 2019-12-22 DIAGNOSIS — G4733 Obstructive sleep apnea (adult) (pediatric): Secondary | ICD-10-CM | POA: Diagnosis not present

## 2019-12-22 DIAGNOSIS — M79602 Pain in left arm: Secondary | ICD-10-CM | POA: Diagnosis not present

## 2019-12-22 DIAGNOSIS — M47816 Spondylosis without myelopathy or radiculopathy, lumbar region: Secondary | ICD-10-CM | POA: Diagnosis not present

## 2019-12-22 DIAGNOSIS — J9601 Acute respiratory failure with hypoxia: Secondary | ICD-10-CM | POA: Diagnosis not present

## 2019-12-22 DIAGNOSIS — I2510 Atherosclerotic heart disease of native coronary artery without angina pectoris: Secondary | ICD-10-CM | POA: Diagnosis not present

## 2019-12-22 DIAGNOSIS — I10 Essential (primary) hypertension: Secondary | ICD-10-CM | POA: Diagnosis not present

## 2019-12-22 DIAGNOSIS — E1142 Type 2 diabetes mellitus with diabetic polyneuropathy: Secondary | ICD-10-CM | POA: Diagnosis present

## 2019-12-22 DIAGNOSIS — J90 Pleural effusion, not elsewhere classified: Secondary | ICD-10-CM | POA: Diagnosis not present

## 2019-12-22 DIAGNOSIS — L509 Urticaria, unspecified: Secondary | ICD-10-CM | POA: Diagnosis present

## 2019-12-22 DIAGNOSIS — S2242XD Multiple fractures of ribs, left side, subsequent encounter for fracture with routine healing: Secondary | ICD-10-CM | POA: Diagnosis not present

## 2019-12-22 DIAGNOSIS — Z7902 Long term (current) use of antithrombotics/antiplatelets: Secondary | ICD-10-CM | POA: Diagnosis not present

## 2019-12-22 DIAGNOSIS — K219 Gastro-esophageal reflux disease without esophagitis: Secondary | ICD-10-CM | POA: Diagnosis present

## 2019-12-22 DIAGNOSIS — S22079A Unspecified fracture of T9-T10 vertebra, initial encounter for closed fracture: Secondary | ICD-10-CM | POA: Diagnosis present

## 2019-12-22 DIAGNOSIS — S59202A Unspecified physeal fracture of lower end of radius, left arm, initial encounter for closed fracture: Secondary | ICD-10-CM | POA: Diagnosis not present

## 2019-12-22 DIAGNOSIS — R05 Cough: Secondary | ICD-10-CM | POA: Diagnosis not present

## 2019-12-22 DIAGNOSIS — S52592A Other fractures of lower end of left radius, initial encounter for closed fracture: Secondary | ICD-10-CM | POA: Diagnosis not present

## 2019-12-22 DIAGNOSIS — E1140 Type 2 diabetes mellitus with diabetic neuropathy, unspecified: Secondary | ICD-10-CM | POA: Diagnosis not present

## 2019-12-22 DIAGNOSIS — Z0000 Encounter for general adult medical examination without abnormal findings: Secondary | ICD-10-CM | POA: Diagnosis not present

## 2019-12-22 DIAGNOSIS — Z7982 Long term (current) use of aspirin: Secondary | ICD-10-CM | POA: Diagnosis not present

## 2019-12-22 DIAGNOSIS — S52392A Other fracture of shaft of radius, left arm, initial encounter for closed fracture: Secondary | ICD-10-CM | POA: Diagnosis present

## 2019-12-22 DIAGNOSIS — E871 Hypo-osmolality and hyponatremia: Secondary | ICD-10-CM | POA: Diagnosis not present

## 2019-12-22 DIAGNOSIS — F329 Major depressive disorder, single episode, unspecified: Secondary | ICD-10-CM | POA: Diagnosis present

## 2019-12-22 DIAGNOSIS — F17211 Nicotine dependence, cigarettes, in remission: Secondary | ICD-10-CM | POA: Diagnosis not present

## 2019-12-22 DIAGNOSIS — S299XXA Unspecified injury of thorax, initial encounter: Secondary | ICD-10-CM | POA: Diagnosis not present

## 2019-12-22 DIAGNOSIS — S52602D Unspecified fracture of lower end of left ulna, subsequent encounter for closed fracture with routine healing: Secondary | ICD-10-CM | POA: Diagnosis not present

## 2019-12-22 DIAGNOSIS — N400 Enlarged prostate without lower urinary tract symptoms: Secondary | ICD-10-CM | POA: Diagnosis present

## 2019-12-22 DIAGNOSIS — T1490XA Injury, unspecified, initial encounter: Secondary | ICD-10-CM | POA: Diagnosis not present

## 2019-12-22 DIAGNOSIS — R079 Chest pain, unspecified: Secondary | ICD-10-CM | POA: Diagnosis not present

## 2019-12-22 DIAGNOSIS — E119 Type 2 diabetes mellitus without complications: Secondary | ICD-10-CM | POA: Diagnosis not present

## 2019-12-22 DIAGNOSIS — T8182XD Emphysema (subcutaneous) resulting from a procedure, subsequent encounter: Secondary | ICD-10-CM | POA: Diagnosis not present

## 2019-12-22 DIAGNOSIS — Z955 Presence of coronary angioplasty implant and graft: Secondary | ICD-10-CM | POA: Diagnosis not present

## 2019-12-22 DIAGNOSIS — S2242XA Multiple fractures of ribs, left side, initial encounter for closed fracture: Secondary | ICD-10-CM | POA: Diagnosis not present

## 2019-12-22 DIAGNOSIS — I252 Old myocardial infarction: Secondary | ICD-10-CM | POA: Diagnosis not present

## 2019-12-22 DIAGNOSIS — S0181XA Laceration without foreign body of other part of head, initial encounter: Secondary | ICD-10-CM | POA: Diagnosis not present

## 2019-12-28 DIAGNOSIS — S52602D Unspecified fracture of lower end of left ulna, subsequent encounter for closed fracture with routine healing: Secondary | ICD-10-CM | POA: Diagnosis not present

## 2019-12-28 DIAGNOSIS — F419 Anxiety disorder, unspecified: Secondary | ICD-10-CM | POA: Diagnosis not present

## 2019-12-28 DIAGNOSIS — E871 Hypo-osmolality and hyponatremia: Secondary | ICD-10-CM | POA: Diagnosis not present

## 2019-12-28 DIAGNOSIS — T1490XA Injury, unspecified, initial encounter: Secondary | ICD-10-CM | POA: Diagnosis not present

## 2019-12-28 DIAGNOSIS — S22078D Other fracture of T9-T10 vertebra, subsequent encounter for fracture with routine healing: Secondary | ICD-10-CM | POA: Diagnosis not present

## 2019-12-28 DIAGNOSIS — M25532 Pain in left wrist: Secondary | ICD-10-CM | POA: Diagnosis not present

## 2019-12-28 DIAGNOSIS — I340 Nonrheumatic mitral (valve) insufficiency: Secondary | ICD-10-CM | POA: Diagnosis not present

## 2019-12-28 DIAGNOSIS — S52602A Unspecified fracture of lower end of left ulna, initial encounter for closed fracture: Secondary | ICD-10-CM | POA: Diagnosis not present

## 2019-12-28 DIAGNOSIS — F17211 Nicotine dependence, cigarettes, in remission: Secondary | ICD-10-CM | POA: Diagnosis not present

## 2019-12-28 DIAGNOSIS — Z955 Presence of coronary angioplasty implant and graft: Secondary | ICD-10-CM | POA: Diagnosis not present

## 2019-12-28 DIAGNOSIS — K219 Gastro-esophageal reflux disease without esophagitis: Secondary | ICD-10-CM | POA: Diagnosis not present

## 2019-12-28 DIAGNOSIS — F17210 Nicotine dependence, cigarettes, uncomplicated: Secondary | ICD-10-CM | POA: Diagnosis not present

## 2019-12-28 DIAGNOSIS — S22000A Wedge compression fracture of unspecified thoracic vertebra, initial encounter for closed fracture: Secondary | ICD-10-CM | POA: Diagnosis not present

## 2019-12-28 DIAGNOSIS — T8182XD Emphysema (subcutaneous) resulting from a procedure, subsequent encounter: Secondary | ICD-10-CM | POA: Diagnosis not present

## 2019-12-28 DIAGNOSIS — M79602 Pain in left arm: Secondary | ICD-10-CM | POA: Diagnosis not present

## 2019-12-28 DIAGNOSIS — S22078S Other fracture of T9-T10 vertebra, sequela: Secondary | ICD-10-CM | POA: Diagnosis not present

## 2019-12-28 DIAGNOSIS — N400 Enlarged prostate without lower urinary tract symptoms: Secondary | ICD-10-CM | POA: Diagnosis not present

## 2019-12-28 DIAGNOSIS — E119 Type 2 diabetes mellitus without complications: Secondary | ICD-10-CM | POA: Diagnosis not present

## 2019-12-28 DIAGNOSIS — Z794 Long term (current) use of insulin: Secondary | ICD-10-CM | POA: Diagnosis not present

## 2019-12-28 DIAGNOSIS — I252 Old myocardial infarction: Secondary | ICD-10-CM | POA: Diagnosis not present

## 2019-12-28 DIAGNOSIS — G4733 Obstructive sleep apnea (adult) (pediatric): Secondary | ICD-10-CM | POA: Diagnosis not present

## 2019-12-28 DIAGNOSIS — Z0000 Encounter for general adult medical examination without abnormal findings: Secondary | ICD-10-CM | POA: Diagnosis not present

## 2019-12-28 DIAGNOSIS — E1140 Type 2 diabetes mellitus with diabetic neuropathy, unspecified: Secondary | ICD-10-CM | POA: Diagnosis not present

## 2019-12-28 DIAGNOSIS — I2510 Atherosclerotic heart disease of native coronary artery without angina pectoris: Secondary | ICD-10-CM | POA: Diagnosis not present

## 2019-12-28 DIAGNOSIS — J9601 Acute respiratory failure with hypoxia: Secondary | ICD-10-CM | POA: Diagnosis not present

## 2019-12-28 DIAGNOSIS — L509 Urticaria, unspecified: Secondary | ICD-10-CM | POA: Diagnosis not present

## 2019-12-28 DIAGNOSIS — M6281 Muscle weakness (generalized): Secondary | ICD-10-CM | POA: Diagnosis not present

## 2019-12-28 DIAGNOSIS — R262 Difficulty in walking, not elsewhere classified: Secondary | ICD-10-CM | POA: Diagnosis not present

## 2019-12-28 DIAGNOSIS — I10 Essential (primary) hypertension: Secondary | ICD-10-CM | POA: Diagnosis not present

## 2019-12-28 DIAGNOSIS — S2242XA Multiple fractures of ribs, left side, initial encounter for closed fracture: Secondary | ICD-10-CM | POA: Diagnosis not present

## 2019-12-28 DIAGNOSIS — S22079A Unspecified fracture of T9-T10 vertebra, initial encounter for closed fracture: Secondary | ICD-10-CM | POA: Diagnosis not present

## 2019-12-28 DIAGNOSIS — E785 Hyperlipidemia, unspecified: Secondary | ICD-10-CM | POA: Diagnosis not present

## 2019-12-28 DIAGNOSIS — S2242XD Multiple fractures of ribs, left side, subsequent encounter for fracture with routine healing: Secondary | ICD-10-CM | POA: Diagnosis not present

## 2019-12-28 DIAGNOSIS — S52612D Displaced fracture of left ulna styloid process, subsequent encounter for closed fracture with routine healing: Secondary | ICD-10-CM | POA: Diagnosis not present

## 2019-12-28 DIAGNOSIS — R109 Unspecified abdominal pain: Secondary | ICD-10-CM | POA: Diagnosis not present

## 2019-12-29 DIAGNOSIS — J9601 Acute respiratory failure with hypoxia: Secondary | ICD-10-CM | POA: Diagnosis not present

## 2019-12-29 DIAGNOSIS — S22078D Other fracture of T9-T10 vertebra, subsequent encounter for fracture with routine healing: Secondary | ICD-10-CM | POA: Diagnosis not present

## 2019-12-29 DIAGNOSIS — I340 Nonrheumatic mitral (valve) insufficiency: Secondary | ICD-10-CM | POA: Diagnosis not present

## 2020-01-01 DIAGNOSIS — J9601 Acute respiratory failure with hypoxia: Secondary | ICD-10-CM | POA: Diagnosis not present

## 2020-01-01 DIAGNOSIS — F419 Anxiety disorder, unspecified: Secondary | ICD-10-CM | POA: Diagnosis not present

## 2020-01-01 DIAGNOSIS — S52612D Displaced fracture of left ulna styloid process, subsequent encounter for closed fracture with routine healing: Secondary | ICD-10-CM | POA: Diagnosis not present

## 2020-01-01 DIAGNOSIS — I10 Essential (primary) hypertension: Secondary | ICD-10-CM | POA: Diagnosis not present

## 2020-01-01 DIAGNOSIS — K219 Gastro-esophageal reflux disease without esophagitis: Secondary | ICD-10-CM | POA: Diagnosis not present

## 2020-01-02 DIAGNOSIS — I10 Essential (primary) hypertension: Secondary | ICD-10-CM | POA: Diagnosis not present

## 2020-01-02 DIAGNOSIS — E119 Type 2 diabetes mellitus without complications: Secondary | ICD-10-CM | POA: Diagnosis not present

## 2020-01-02 DIAGNOSIS — E785 Hyperlipidemia, unspecified: Secondary | ICD-10-CM | POA: Diagnosis not present

## 2020-01-05 DIAGNOSIS — S22078D Other fracture of T9-T10 vertebra, subsequent encounter for fracture with routine healing: Secondary | ICD-10-CM | POA: Diagnosis not present

## 2020-01-05 DIAGNOSIS — S52602D Unspecified fracture of lower end of left ulna, subsequent encounter for closed fracture with routine healing: Secondary | ICD-10-CM | POA: Diagnosis not present

## 2020-01-05 DIAGNOSIS — S22078S Other fracture of T9-T10 vertebra, sequela: Secondary | ICD-10-CM | POA: Diagnosis not present

## 2020-01-08 DIAGNOSIS — I340 Nonrheumatic mitral (valve) insufficiency: Secondary | ICD-10-CM | POA: Diagnosis not present

## 2020-01-08 DIAGNOSIS — I10 Essential (primary) hypertension: Secondary | ICD-10-CM | POA: Diagnosis not present

## 2020-01-09 DIAGNOSIS — I10 Essential (primary) hypertension: Secondary | ICD-10-CM | POA: Diagnosis not present

## 2020-01-10 DIAGNOSIS — S22000A Wedge compression fracture of unspecified thoracic vertebra, initial encounter for closed fracture: Secondary | ICD-10-CM | POA: Diagnosis not present

## 2020-01-11 DIAGNOSIS — S22078S Other fracture of T9-T10 vertebra, sequela: Secondary | ICD-10-CM | POA: Diagnosis not present

## 2020-01-11 DIAGNOSIS — I340 Nonrheumatic mitral (valve) insufficiency: Secondary | ICD-10-CM | POA: Diagnosis not present

## 2020-01-11 DIAGNOSIS — S2242XD Multiple fractures of ribs, left side, subsequent encounter for fracture with routine healing: Secondary | ICD-10-CM | POA: Diagnosis not present

## 2020-01-11 DIAGNOSIS — S22078D Other fracture of T9-T10 vertebra, subsequent encounter for fracture with routine healing: Secondary | ICD-10-CM | POA: Diagnosis not present

## 2020-01-18 DIAGNOSIS — K219 Gastro-esophageal reflux disease without esophagitis: Secondary | ICD-10-CM | POA: Diagnosis not present

## 2020-01-18 DIAGNOSIS — I10 Essential (primary) hypertension: Secondary | ICD-10-CM | POA: Diagnosis not present

## 2020-01-18 DIAGNOSIS — E799 Disorder of purine and pyrimidine metabolism, unspecified: Secondary | ICD-10-CM | POA: Diagnosis not present

## 2020-01-18 DIAGNOSIS — E1165 Type 2 diabetes mellitus with hyperglycemia: Secondary | ICD-10-CM | POA: Diagnosis not present

## 2020-01-23 DIAGNOSIS — S52612D Displaced fracture of left ulna styloid process, subsequent encounter for closed fracture with routine healing: Secondary | ICD-10-CM | POA: Diagnosis not present

## 2020-01-23 DIAGNOSIS — S52502D Unspecified fracture of the lower end of left radius, subsequent encounter for closed fracture with routine healing: Secondary | ICD-10-CM | POA: Diagnosis not present

## 2020-01-25 DIAGNOSIS — Z6833 Body mass index (BMI) 33.0-33.9, adult: Secondary | ICD-10-CM | POA: Diagnosis not present

## 2020-01-25 DIAGNOSIS — I10 Essential (primary) hypertension: Secondary | ICD-10-CM | POA: Diagnosis not present

## 2020-01-25 DIAGNOSIS — E1165 Type 2 diabetes mellitus with hyperglycemia: Secondary | ICD-10-CM | POA: Diagnosis not present

## 2020-01-25 DIAGNOSIS — M79606 Pain in leg, unspecified: Secondary | ICD-10-CM | POA: Diagnosis not present

## 2020-01-25 DIAGNOSIS — Z299 Encounter for prophylactic measures, unspecified: Secondary | ICD-10-CM | POA: Diagnosis not present

## 2020-01-25 DIAGNOSIS — R413 Other amnesia: Secondary | ICD-10-CM | POA: Diagnosis not present

## 2020-02-01 DIAGNOSIS — S90852A Superficial foreign body, left foot, initial encounter: Secondary | ICD-10-CM | POA: Diagnosis not present

## 2020-02-01 DIAGNOSIS — Z23 Encounter for immunization: Secondary | ICD-10-CM | POA: Diagnosis not present

## 2020-02-01 DIAGNOSIS — Z6833 Body mass index (BMI) 33.0-33.9, adult: Secondary | ICD-10-CM | POA: Diagnosis not present

## 2020-02-01 DIAGNOSIS — Z299 Encounter for prophylactic measures, unspecified: Secondary | ICD-10-CM | POA: Diagnosis not present

## 2020-02-01 DIAGNOSIS — I10 Essential (primary) hypertension: Secondary | ICD-10-CM | POA: Diagnosis not present

## 2020-02-07 DIAGNOSIS — S22078A Other fracture of T9-T10 vertebra, initial encounter for closed fracture: Secondary | ICD-10-CM | POA: Diagnosis not present

## 2020-02-07 DIAGNOSIS — S22000D Wedge compression fracture of unspecified thoracic vertebra, subsequent encounter for fracture with routine healing: Secondary | ICD-10-CM | POA: Diagnosis not present

## 2020-02-07 DIAGNOSIS — X58XXXA Exposure to other specified factors, initial encounter: Secondary | ICD-10-CM | POA: Diagnosis not present

## 2020-02-12 DIAGNOSIS — S90452A Superficial foreign body, left great toe, initial encounter: Secondary | ICD-10-CM | POA: Diagnosis not present

## 2020-02-12 DIAGNOSIS — E1151 Type 2 diabetes mellitus with diabetic peripheral angiopathy without gangrene: Secondary | ICD-10-CM | POA: Diagnosis not present

## 2020-02-12 DIAGNOSIS — E1140 Type 2 diabetes mellitus with diabetic neuropathy, unspecified: Secondary | ICD-10-CM | POA: Diagnosis not present

## 2020-02-12 DIAGNOSIS — M79672 Pain in left foot: Secondary | ICD-10-CM | POA: Diagnosis not present

## 2020-03-12 DIAGNOSIS — Z299 Encounter for prophylactic measures, unspecified: Secondary | ICD-10-CM | POA: Diagnosis not present

## 2020-03-12 DIAGNOSIS — I2510 Atherosclerotic heart disease of native coronary artery without angina pectoris: Secondary | ICD-10-CM | POA: Diagnosis not present

## 2020-03-12 DIAGNOSIS — E1165 Type 2 diabetes mellitus with hyperglycemia: Secondary | ICD-10-CM | POA: Diagnosis not present

## 2020-03-12 DIAGNOSIS — I10 Essential (primary) hypertension: Secondary | ICD-10-CM | POA: Diagnosis not present

## 2020-03-12 DIAGNOSIS — Z6834 Body mass index (BMI) 34.0-34.9, adult: Secondary | ICD-10-CM | POA: Diagnosis not present

## 2020-03-12 DIAGNOSIS — G4733 Obstructive sleep apnea (adult) (pediatric): Secondary | ICD-10-CM | POA: Diagnosis not present

## 2020-03-13 DIAGNOSIS — Z23 Encounter for immunization: Secondary | ICD-10-CM | POA: Diagnosis not present

## 2020-03-19 DIAGNOSIS — K219 Gastro-esophageal reflux disease without esophagitis: Secondary | ICD-10-CM | POA: Diagnosis not present

## 2020-03-19 DIAGNOSIS — I10 Essential (primary) hypertension: Secondary | ICD-10-CM | POA: Diagnosis not present

## 2020-03-19 DIAGNOSIS — E798 Other disorders of purine and pyrimidine metabolism: Secondary | ICD-10-CM | POA: Diagnosis not present

## 2020-03-19 DIAGNOSIS — E1165 Type 2 diabetes mellitus with hyperglycemia: Secondary | ICD-10-CM | POA: Diagnosis not present

## 2020-04-03 DIAGNOSIS — S22000D Wedge compression fracture of unspecified thoracic vertebra, subsequent encounter for fracture with routine healing: Secondary | ICD-10-CM | POA: Diagnosis not present

## 2020-04-03 DIAGNOSIS — X58XXXD Exposure to other specified factors, subsequent encounter: Secondary | ICD-10-CM | POA: Diagnosis not present

## 2020-04-03 DIAGNOSIS — S22078D Other fracture of T9-T10 vertebra, subsequent encounter for fracture with routine healing: Secondary | ICD-10-CM | POA: Diagnosis not present

## 2020-04-03 DIAGNOSIS — M5116 Intervertebral disc disorders with radiculopathy, lumbar region: Secondary | ICD-10-CM | POA: Diagnosis not present

## 2020-04-29 DIAGNOSIS — E1140 Type 2 diabetes mellitus with diabetic neuropathy, unspecified: Secondary | ICD-10-CM | POA: Diagnosis not present

## 2020-04-29 DIAGNOSIS — E1151 Type 2 diabetes mellitus with diabetic peripheral angiopathy without gangrene: Secondary | ICD-10-CM | POA: Diagnosis not present

## 2020-05-08 DIAGNOSIS — M545 Low back pain, unspecified: Secondary | ICD-10-CM | POA: Diagnosis not present

## 2020-05-08 DIAGNOSIS — Z87442 Personal history of urinary calculi: Secondary | ICD-10-CM | POA: Diagnosis not present

## 2020-05-08 DIAGNOSIS — N200 Calculus of kidney: Secondary | ICD-10-CM | POA: Diagnosis not present

## 2020-05-09 DIAGNOSIS — Z299 Encounter for prophylactic measures, unspecified: Secondary | ICD-10-CM | POA: Diagnosis not present

## 2020-05-09 DIAGNOSIS — Z0000 Encounter for general adult medical examination without abnormal findings: Secondary | ICD-10-CM | POA: Diagnosis not present

## 2020-05-09 DIAGNOSIS — Z79899 Other long term (current) drug therapy: Secondary | ICD-10-CM | POA: Diagnosis not present

## 2020-05-09 DIAGNOSIS — E7800 Pure hypercholesterolemia, unspecified: Secondary | ICD-10-CM | POA: Diagnosis not present

## 2020-05-09 DIAGNOSIS — R5383 Other fatigue: Secondary | ICD-10-CM | POA: Diagnosis not present

## 2020-05-09 DIAGNOSIS — Z7189 Other specified counseling: Secondary | ICD-10-CM | POA: Diagnosis not present

## 2020-05-09 DIAGNOSIS — E1165 Type 2 diabetes mellitus with hyperglycemia: Secondary | ICD-10-CM | POA: Diagnosis not present

## 2020-05-09 DIAGNOSIS — Z1339 Encounter for screening examination for other mental health and behavioral disorders: Secondary | ICD-10-CM | POA: Diagnosis not present

## 2020-05-09 DIAGNOSIS — Z125 Encounter for screening for malignant neoplasm of prostate: Secondary | ICD-10-CM | POA: Diagnosis not present

## 2020-05-09 DIAGNOSIS — Z6836 Body mass index (BMI) 36.0-36.9, adult: Secondary | ICD-10-CM | POA: Diagnosis not present

## 2020-05-20 DIAGNOSIS — K219 Gastro-esophageal reflux disease without esophagitis: Secondary | ICD-10-CM | POA: Diagnosis not present

## 2020-05-20 DIAGNOSIS — E798 Other disorders of purine and pyrimidine metabolism: Secondary | ICD-10-CM | POA: Diagnosis not present

## 2020-05-20 DIAGNOSIS — I10 Essential (primary) hypertension: Secondary | ICD-10-CM | POA: Diagnosis not present

## 2020-05-20 DIAGNOSIS — E1165 Type 2 diabetes mellitus with hyperglycemia: Secondary | ICD-10-CM | POA: Diagnosis not present

## 2020-05-28 DIAGNOSIS — M48062 Spinal stenosis, lumbar region with neurogenic claudication: Secondary | ICD-10-CM | POA: Diagnosis not present

## 2020-05-28 DIAGNOSIS — M5416 Radiculopathy, lumbar region: Secondary | ICD-10-CM | POA: Diagnosis not present

## 2020-05-28 DIAGNOSIS — Z7982 Long term (current) use of aspirin: Secondary | ICD-10-CM | POA: Diagnosis not present

## 2020-06-04 DIAGNOSIS — L821 Other seborrheic keratosis: Secondary | ICD-10-CM | POA: Diagnosis not present

## 2020-06-04 DIAGNOSIS — L209 Atopic dermatitis, unspecified: Secondary | ICD-10-CM | POA: Diagnosis not present

## 2020-06-04 DIAGNOSIS — Z85828 Personal history of other malignant neoplasm of skin: Secondary | ICD-10-CM | POA: Diagnosis not present

## 2020-06-04 DIAGNOSIS — L570 Actinic keratosis: Secondary | ICD-10-CM | POA: Diagnosis not present

## 2020-06-06 DIAGNOSIS — M545 Low back pain, unspecified: Secondary | ICD-10-CM | POA: Diagnosis not present

## 2020-06-06 DIAGNOSIS — N400 Enlarged prostate without lower urinary tract symptoms: Secondary | ICD-10-CM | POA: Diagnosis not present

## 2020-06-06 DIAGNOSIS — N200 Calculus of kidney: Secondary | ICD-10-CM | POA: Diagnosis not present

## 2020-06-10 DIAGNOSIS — Z87891 Personal history of nicotine dependence: Secondary | ICD-10-CM | POA: Diagnosis not present

## 2020-06-10 DIAGNOSIS — I10 Essential (primary) hypertension: Secondary | ICD-10-CM | POA: Diagnosis not present

## 2020-06-10 DIAGNOSIS — G4733 Obstructive sleep apnea (adult) (pediatric): Secondary | ICD-10-CM | POA: Diagnosis not present

## 2020-06-10 DIAGNOSIS — E1165 Type 2 diabetes mellitus with hyperglycemia: Secondary | ICD-10-CM | POA: Diagnosis not present

## 2020-06-10 DIAGNOSIS — Z6834 Body mass index (BMI) 34.0-34.9, adult: Secondary | ICD-10-CM | POA: Diagnosis not present

## 2020-06-10 DIAGNOSIS — I2510 Atherosclerotic heart disease of native coronary artery without angina pectoris: Secondary | ICD-10-CM | POA: Diagnosis not present

## 2020-06-10 DIAGNOSIS — Z299 Encounter for prophylactic measures, unspecified: Secondary | ICD-10-CM | POA: Diagnosis not present

## 2020-06-17 DIAGNOSIS — E798 Other disorders of purine and pyrimidine metabolism: Secondary | ICD-10-CM | POA: Diagnosis not present

## 2020-06-17 DIAGNOSIS — I10 Essential (primary) hypertension: Secondary | ICD-10-CM | POA: Diagnosis not present

## 2020-06-17 DIAGNOSIS — E1165 Type 2 diabetes mellitus with hyperglycemia: Secondary | ICD-10-CM | POA: Diagnosis not present

## 2020-06-21 DIAGNOSIS — E1165 Type 2 diabetes mellitus with hyperglycemia: Secondary | ICD-10-CM | POA: Diagnosis not present

## 2020-06-21 DIAGNOSIS — I2510 Atherosclerotic heart disease of native coronary artery without angina pectoris: Secondary | ICD-10-CM | POA: Diagnosis not present

## 2020-06-21 DIAGNOSIS — J019 Acute sinusitis, unspecified: Secondary | ICD-10-CM | POA: Diagnosis not present

## 2020-06-21 DIAGNOSIS — Z87891 Personal history of nicotine dependence: Secondary | ICD-10-CM | POA: Diagnosis not present

## 2020-06-21 DIAGNOSIS — Z299 Encounter for prophylactic measures, unspecified: Secondary | ICD-10-CM | POA: Diagnosis not present

## 2020-06-21 DIAGNOSIS — Z6834 Body mass index (BMI) 34.0-34.9, adult: Secondary | ICD-10-CM | POA: Diagnosis not present

## 2020-06-21 DIAGNOSIS — I10 Essential (primary) hypertension: Secondary | ICD-10-CM | POA: Diagnosis not present

## 2020-07-04 DIAGNOSIS — E119 Type 2 diabetes mellitus without complications: Secondary | ICD-10-CM | POA: Diagnosis not present

## 2020-07-04 DIAGNOSIS — H04123 Dry eye syndrome of bilateral lacrimal glands: Secondary | ICD-10-CM | POA: Diagnosis not present

## 2020-07-04 DIAGNOSIS — H1045 Other chronic allergic conjunctivitis: Secondary | ICD-10-CM | POA: Diagnosis not present

## 2020-07-04 DIAGNOSIS — H35373 Puckering of macula, bilateral: Secondary | ICD-10-CM | POA: Diagnosis not present

## 2020-07-04 DIAGNOSIS — H524 Presbyopia: Secondary | ICD-10-CM | POA: Diagnosis not present

## 2020-07-04 DIAGNOSIS — Z961 Presence of intraocular lens: Secondary | ICD-10-CM | POA: Diagnosis not present

## 2020-07-04 DIAGNOSIS — D3132 Benign neoplasm of left choroid: Secondary | ICD-10-CM | POA: Diagnosis not present

## 2020-07-04 DIAGNOSIS — H3540 Unspecified peripheral retinal degeneration: Secondary | ICD-10-CM | POA: Diagnosis not present

## 2020-07-15 DIAGNOSIS — E1151 Type 2 diabetes mellitus with diabetic peripheral angiopathy without gangrene: Secondary | ICD-10-CM | POA: Diagnosis not present

## 2020-07-15 DIAGNOSIS — E1140 Type 2 diabetes mellitus with diabetic neuropathy, unspecified: Secondary | ICD-10-CM | POA: Diagnosis not present

## 2020-07-18 DIAGNOSIS — G4730 Sleep apnea, unspecified: Secondary | ICD-10-CM | POA: Diagnosis not present

## 2020-07-18 DIAGNOSIS — E119 Type 2 diabetes mellitus without complications: Secondary | ICD-10-CM | POA: Diagnosis not present

## 2020-07-18 DIAGNOSIS — R55 Syncope and collapse: Secondary | ICD-10-CM | POA: Diagnosis not present

## 2020-07-18 DIAGNOSIS — I10 Essential (primary) hypertension: Secondary | ICD-10-CM | POA: Diagnosis not present

## 2020-07-18 DIAGNOSIS — I2510 Atherosclerotic heart disease of native coronary artery without angina pectoris: Secondary | ICD-10-CM | POA: Diagnosis not present

## 2020-07-18 DIAGNOSIS — R002 Palpitations: Secondary | ICD-10-CM | POA: Diagnosis not present

## 2020-07-18 DIAGNOSIS — R0600 Dyspnea, unspecified: Secondary | ICD-10-CM | POA: Diagnosis not present

## 2020-07-23 DIAGNOSIS — I10 Essential (primary) hypertension: Secondary | ICD-10-CM | POA: Diagnosis not present

## 2020-07-23 DIAGNOSIS — M62830 Muscle spasm of back: Secondary | ICD-10-CM | POA: Diagnosis not present

## 2020-07-23 DIAGNOSIS — Z299 Encounter for prophylactic measures, unspecified: Secondary | ICD-10-CM | POA: Diagnosis not present

## 2020-07-23 DIAGNOSIS — G4733 Obstructive sleep apnea (adult) (pediatric): Secondary | ICD-10-CM | POA: Diagnosis not present

## 2020-07-23 DIAGNOSIS — M549 Dorsalgia, unspecified: Secondary | ICD-10-CM | POA: Diagnosis not present

## 2020-07-24 DIAGNOSIS — R0609 Other forms of dyspnea: Secondary | ICD-10-CM | POA: Diagnosis not present

## 2020-07-24 DIAGNOSIS — I2510 Atherosclerotic heart disease of native coronary artery without angina pectoris: Secondary | ICD-10-CM | POA: Diagnosis not present

## 2020-07-24 DIAGNOSIS — R0600 Dyspnea, unspecified: Secondary | ICD-10-CM | POA: Diagnosis not present

## 2020-07-24 DIAGNOSIS — R55 Syncope and collapse: Secondary | ICD-10-CM | POA: Diagnosis not present

## 2020-07-25 DIAGNOSIS — R0789 Other chest pain: Secondary | ICD-10-CM | POA: Diagnosis not present

## 2020-07-25 DIAGNOSIS — R0602 Shortness of breath: Secondary | ICD-10-CM | POA: Diagnosis not present

## 2020-07-25 DIAGNOSIS — I2510 Atherosclerotic heart disease of native coronary artery without angina pectoris: Secondary | ICD-10-CM | POA: Diagnosis not present

## 2020-08-12 DIAGNOSIS — G4733 Obstructive sleep apnea (adult) (pediatric): Secondary | ICD-10-CM | POA: Diagnosis not present

## 2020-08-12 DIAGNOSIS — I10 Essential (primary) hypertension: Secondary | ICD-10-CM | POA: Diagnosis not present

## 2020-08-12 DIAGNOSIS — Z6835 Body mass index (BMI) 35.0-35.9, adult: Secondary | ICD-10-CM | POA: Diagnosis not present

## 2020-08-12 DIAGNOSIS — Z299 Encounter for prophylactic measures, unspecified: Secondary | ICD-10-CM | POA: Diagnosis not present

## 2020-08-12 DIAGNOSIS — E1165 Type 2 diabetes mellitus with hyperglycemia: Secondary | ICD-10-CM | POA: Diagnosis not present

## 2020-08-17 DIAGNOSIS — E798 Other disorders of purine and pyrimidine metabolism: Secondary | ICD-10-CM | POA: Diagnosis not present

## 2020-08-17 DIAGNOSIS — E1165 Type 2 diabetes mellitus with hyperglycemia: Secondary | ICD-10-CM | POA: Diagnosis not present

## 2020-08-17 DIAGNOSIS — I10 Essential (primary) hypertension: Secondary | ICD-10-CM | POA: Diagnosis not present

## 2020-08-27 DIAGNOSIS — M5416 Radiculopathy, lumbar region: Secondary | ICD-10-CM | POA: Diagnosis not present

## 2020-08-27 DIAGNOSIS — M48062 Spinal stenosis, lumbar region with neurogenic claudication: Secondary | ICD-10-CM | POA: Diagnosis not present

## 2020-08-29 DIAGNOSIS — M48062 Spinal stenosis, lumbar region with neurogenic claudication: Secondary | ICD-10-CM | POA: Diagnosis not present

## 2020-08-29 DIAGNOSIS — M5416 Radiculopathy, lumbar region: Secondary | ICD-10-CM | POA: Diagnosis not present

## 2020-08-29 DIAGNOSIS — S22000D Wedge compression fracture of unspecified thoracic vertebra, subsequent encounter for fracture with routine healing: Secondary | ICD-10-CM | POA: Diagnosis not present

## 2020-09-07 DIAGNOSIS — Z7982 Long term (current) use of aspirin: Secondary | ICD-10-CM | POA: Diagnosis not present

## 2020-09-07 DIAGNOSIS — I252 Old myocardial infarction: Secondary | ICD-10-CM | POA: Diagnosis not present

## 2020-09-07 DIAGNOSIS — E119 Type 2 diabetes mellitus without complications: Secondary | ICD-10-CM | POA: Diagnosis not present

## 2020-09-07 DIAGNOSIS — N23 Unspecified renal colic: Secondary | ICD-10-CM | POA: Diagnosis not present

## 2020-09-07 DIAGNOSIS — Z87891 Personal history of nicotine dependence: Secondary | ICD-10-CM | POA: Diagnosis not present

## 2020-09-07 DIAGNOSIS — N200 Calculus of kidney: Secondary | ICD-10-CM | POA: Diagnosis not present

## 2020-09-07 DIAGNOSIS — Z794 Long term (current) use of insulin: Secondary | ICD-10-CM | POA: Diagnosis not present

## 2020-09-07 DIAGNOSIS — Z955 Presence of coronary angioplasty implant and graft: Secondary | ICD-10-CM | POA: Diagnosis not present

## 2020-09-07 DIAGNOSIS — R109 Unspecified abdominal pain: Secondary | ICD-10-CM | POA: Diagnosis not present

## 2020-09-07 DIAGNOSIS — K859 Acute pancreatitis without necrosis or infection, unspecified: Secondary | ICD-10-CM | POA: Diagnosis not present

## 2020-09-07 DIAGNOSIS — I10 Essential (primary) hypertension: Secondary | ICD-10-CM | POA: Diagnosis not present

## 2020-09-07 DIAGNOSIS — I2510 Atherosclerotic heart disease of native coronary artery without angina pectoris: Secondary | ICD-10-CM | POA: Diagnosis not present

## 2020-09-07 DIAGNOSIS — Z79899 Other long term (current) drug therapy: Secondary | ICD-10-CM | POA: Diagnosis not present

## 2020-09-18 DIAGNOSIS — Z299 Encounter for prophylactic measures, unspecified: Secondary | ICD-10-CM | POA: Diagnosis not present

## 2020-09-18 DIAGNOSIS — E1165 Type 2 diabetes mellitus with hyperglycemia: Secondary | ICD-10-CM | POA: Diagnosis not present

## 2020-09-18 DIAGNOSIS — Z6833 Body mass index (BMI) 33.0-33.9, adult: Secondary | ICD-10-CM | POA: Diagnosis not present

## 2020-09-18 DIAGNOSIS — I10 Essential (primary) hypertension: Secondary | ICD-10-CM | POA: Diagnosis not present

## 2020-09-18 DIAGNOSIS — M549 Dorsalgia, unspecified: Secondary | ICD-10-CM | POA: Diagnosis not present

## 2020-09-27 DIAGNOSIS — M2578 Osteophyte, vertebrae: Secondary | ICD-10-CM | POA: Diagnosis not present

## 2020-09-27 DIAGNOSIS — M48061 Spinal stenosis, lumbar region without neurogenic claudication: Secondary | ICD-10-CM | POA: Diagnosis not present

## 2020-09-27 DIAGNOSIS — M47816 Spondylosis without myelopathy or radiculopathy, lumbar region: Secondary | ICD-10-CM | POA: Diagnosis not present

## 2020-10-10 DIAGNOSIS — E1151 Type 2 diabetes mellitus with diabetic peripheral angiopathy without gangrene: Secondary | ICD-10-CM | POA: Diagnosis not present

## 2020-10-10 DIAGNOSIS — E1140 Type 2 diabetes mellitus with diabetic neuropathy, unspecified: Secondary | ICD-10-CM | POA: Diagnosis not present

## 2020-10-17 DIAGNOSIS — E798 Other disorders of purine and pyrimidine metabolism: Secondary | ICD-10-CM | POA: Diagnosis not present

## 2020-10-17 DIAGNOSIS — I10 Essential (primary) hypertension: Secondary | ICD-10-CM | POA: Diagnosis not present

## 2020-10-17 DIAGNOSIS — E1165 Type 2 diabetes mellitus with hyperglycemia: Secondary | ICD-10-CM | POA: Diagnosis not present

## 2020-10-18 DIAGNOSIS — R600 Localized edema: Secondary | ICD-10-CM | POA: Diagnosis not present

## 2020-10-18 DIAGNOSIS — R936 Abnormal findings on diagnostic imaging of limbs: Secondary | ICD-10-CM | POA: Diagnosis not present

## 2020-10-18 DIAGNOSIS — M7732 Calcaneal spur, left foot: Secondary | ICD-10-CM | POA: Diagnosis not present

## 2020-10-18 DIAGNOSIS — M25472 Effusion, left ankle: Secondary | ICD-10-CM | POA: Diagnosis not present

## 2020-10-18 DIAGNOSIS — M7989 Other specified soft tissue disorders: Secondary | ICD-10-CM | POA: Diagnosis not present

## 2020-10-18 DIAGNOSIS — M25572 Pain in left ankle and joints of left foot: Secondary | ICD-10-CM | POA: Diagnosis not present

## 2020-10-29 DIAGNOSIS — M79675 Pain in left toe(s): Secondary | ICD-10-CM | POA: Diagnosis not present

## 2020-10-29 DIAGNOSIS — M79672 Pain in left foot: Secondary | ICD-10-CM | POA: Diagnosis not present

## 2020-10-29 DIAGNOSIS — M76822 Posterior tibial tendinitis, left leg: Secondary | ICD-10-CM | POA: Diagnosis not present

## 2020-11-13 IMAGING — MR MR WRIST*R* W/O CM
6 series · 40 of 40 positions shown · non-contrast
Comparison: None.

CLINICAL DATA: Anteromedial wrist pain. History of carpal tunnel
release 11/14/2015.

EXAM:
MR OF THE RIGHT WRIST WITHOUT CONTRAST
TECHNIQUE: Multiplanar, multisequence MR imaging of the right wrist was
performed. No intravenous contrast was administered.

[Series 3: T2 fat-sat · axial · right · 3.0mm · 0.31mm/px · z∈[-79,+19]mm · 10 of 33 slices shown (1 of 2)]
[im 1/33]
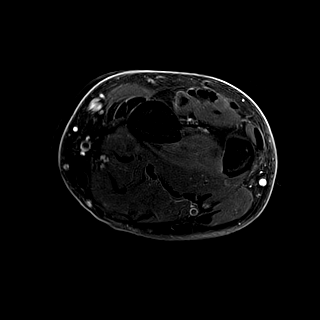
[im 4/33]
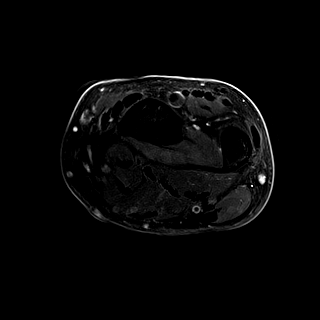
[im 8/33]
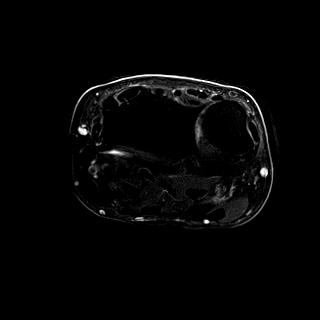
[im 11/33]
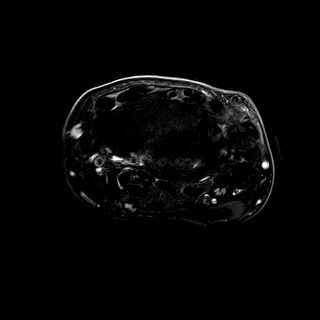
[im 15/33]
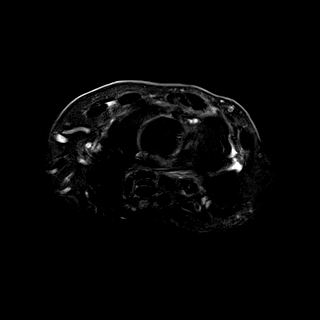
[im 18/33]
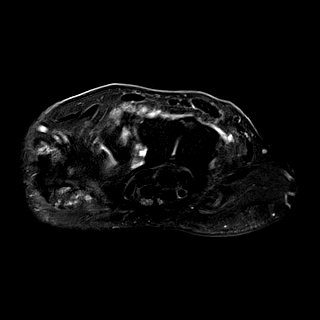
[im 22/33]
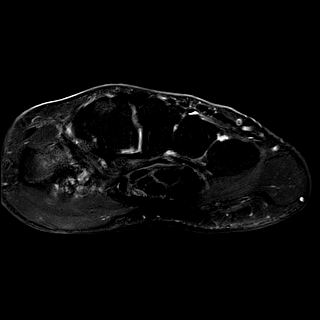
[im 25/33]
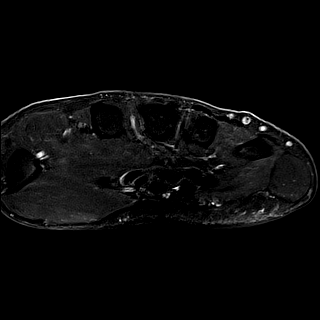
[im 29/33]
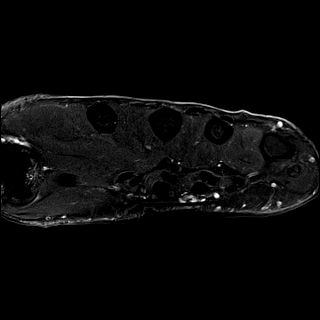
[im 33/33]
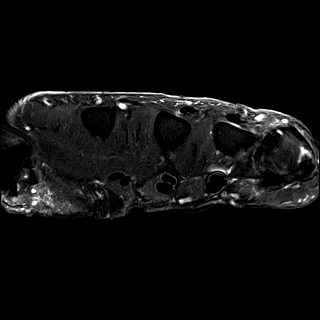

[Series 4: T1 · coronal · right · 3.0mm · 0.26mm/px · 5 of 17 slices shown (1 of 2)]
[im 1/17]
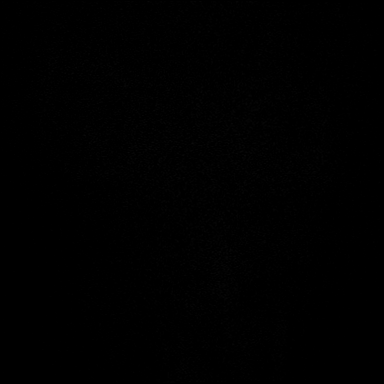
[im 5/17]
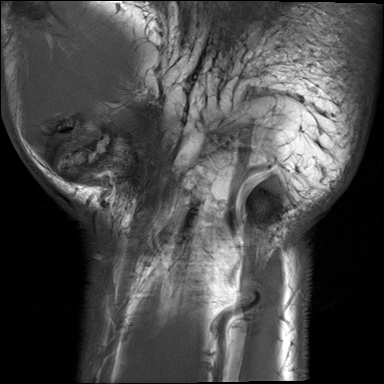
[im 9/17]
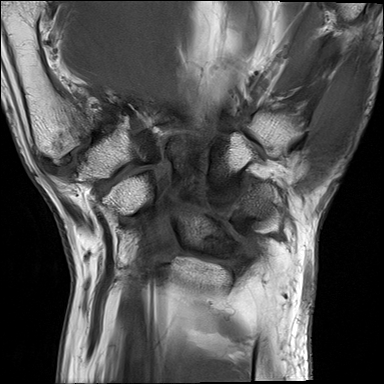
[im 13/17]
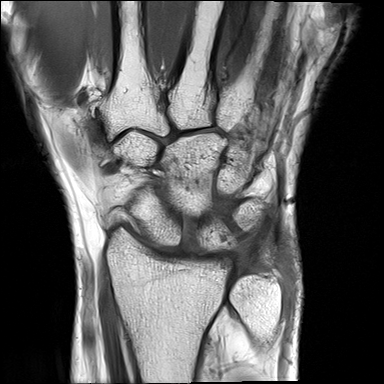
[im 17/17]
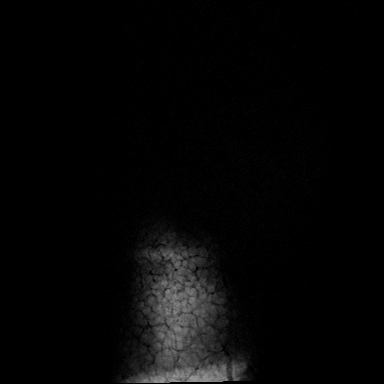

[Series 5: T1 · axial · right · 4.0mm · 0.31mm/px · z∈[-77,+17]mm · 7 of 24 slices shown (2 of 2)]
[im 1/24]
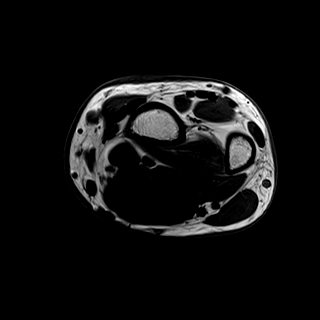
[im 4/24]
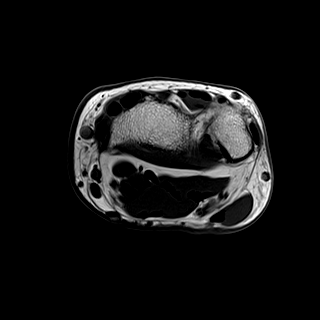
[im 8/24]
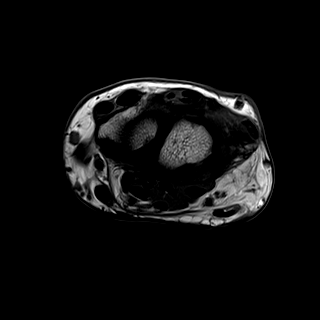
[im 12/24]
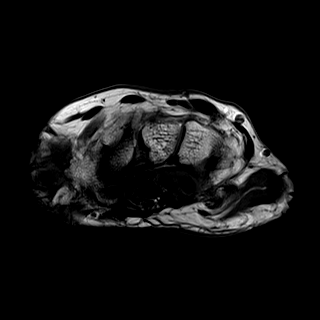
[im 16/24]
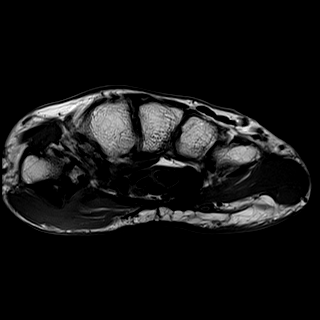
[im 20/24]
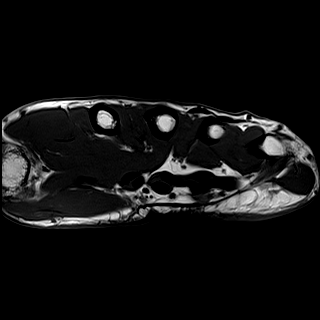
[im 24/24]
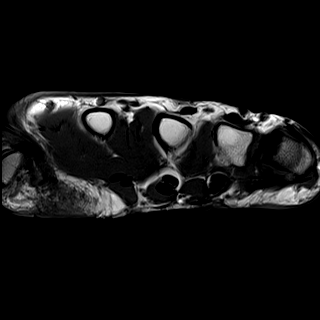

[Series 6: T2 fat-sat · coronal · right · 3.0mm · 0.31mm/px · 5 of 17 slices shown (2 of 2)]
[im 1/17]
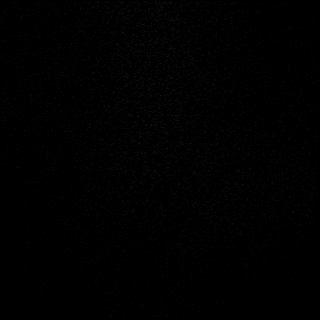
[im 5/17]
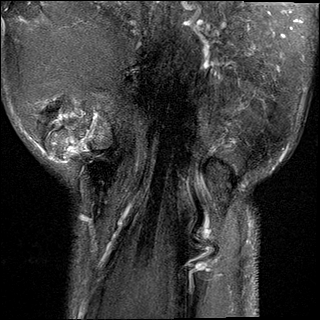
[im 9/17]
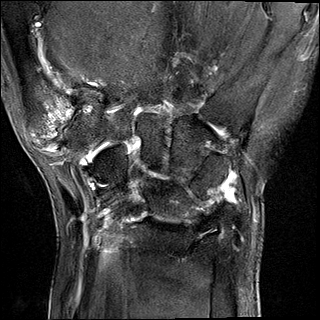
[im 13/17]
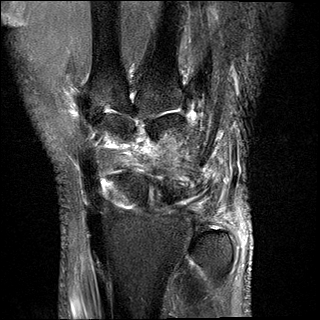
[im 17/17]
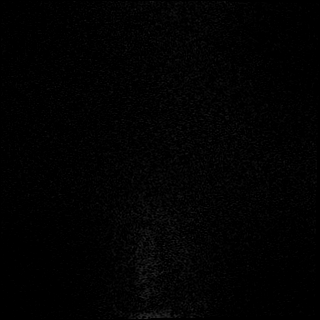

[Series 7: PD fat-sat · coronal · right · 3.0mm · 0.31mm/px · 5 of 17 slices shown (1 of 2)]
[im 1/17]
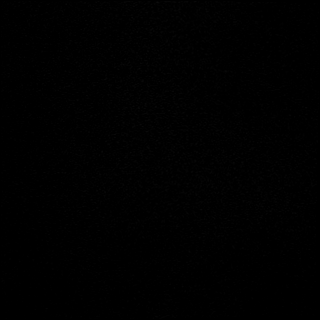
[im 5/17]
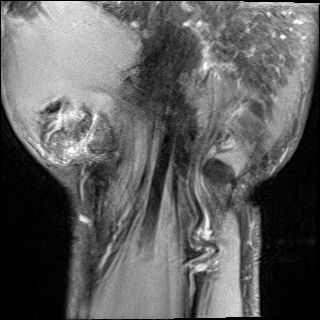
[im 9/17]
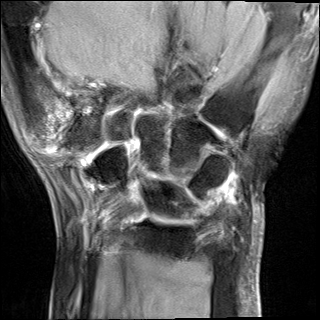
[im 13/17]
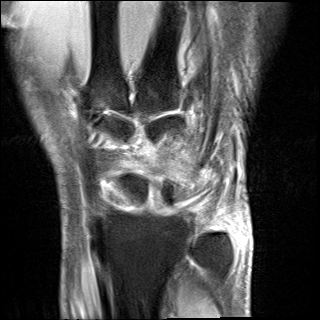
[im 17/17]
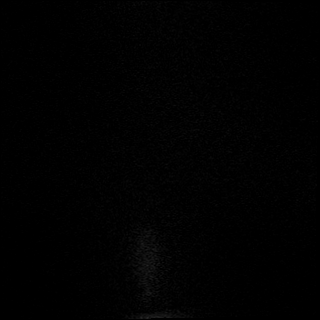

[Series 8: PD fat-sat · sagittal · right · 3.0mm · 0.38mm/px · 8 of 27 slices shown (2 of 2)]
[im 1/27]
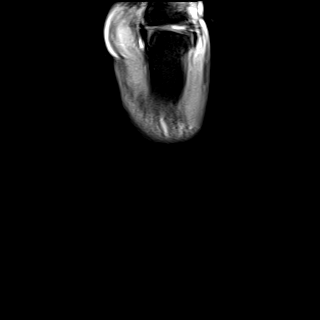
[im 4/27]
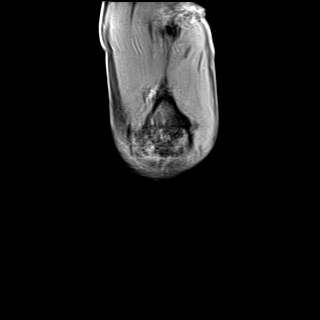
[im 8/27]
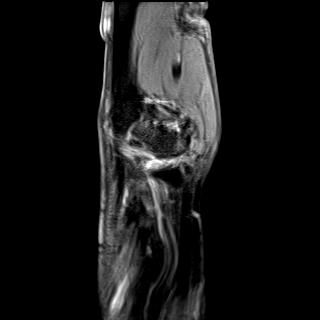
[im 12/27]
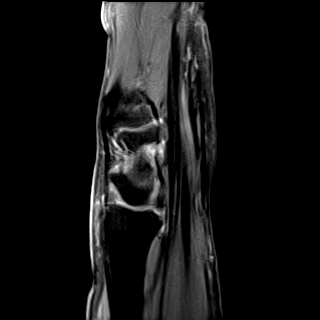
[im 15/27]
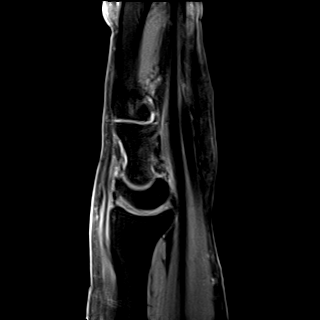
[im 19/27]
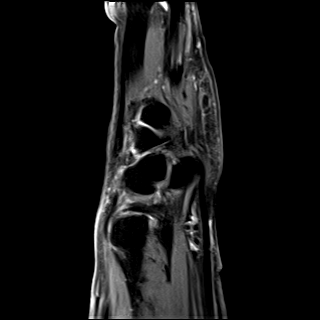
[im 23/27]
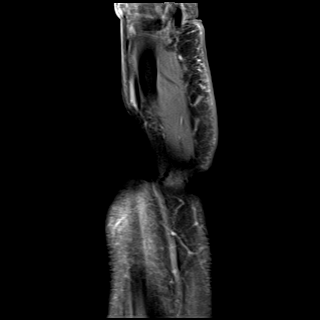
[im 27/27]
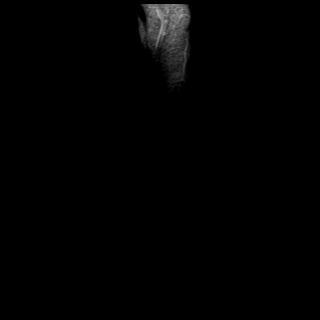

[40 of 40 positions shown; findings below may reference images not displayed]

FINDINGS: Despite efforts by the technologist and patient, mild motion
artifact is present on today's exam and could not be eliminated.
This reduces exam sensitivity and specificity.

Ligaments: The scapholunate and lunotriquetral ligaments appear
intact.

Triangular fibrocartilage: The triangular fibrocartilage complex
appears intact. The ulnar variance is neutral.

Tendons: The flexor and extensor tendons appear unremarkable.

Carpal tunnel/median nerve: The median nerve is normal in signal and
caliber. No significant thickening of the flexor retinaculum. No
mass, fluid collection or inflammation in the carpal tunnel.

Guyon's canal: Unremarkable.

Joint/cartilage: There are advanced degenerative changes at the 1st
carpometacarpal articulation with diffuse joint space narrowing,
osteophytes and subchondral cyst formation. The 1st metacarpal is
mildly subluxed proximally. Mild degenerative changes are present at
the 2nd carpometacarpal articulation.

Bones/carpal alignment: The carpal bone alignment is normal.There
are no significant extra-articular osseous findings.

Other: Mild soft tissue edema surrounding the 1st CMC joint without
discrete ganglia.
IMPRESSION: 1. Advanced degenerative changes at the 1st CMC articulation.
2. The median nerve and additional contents of the carpal tunnel
appear unremarkable.
3. The intercarpal ligaments and TFCC appear intact. No significant
tendon findings.

## 2020-11-18 DIAGNOSIS — K921 Melena: Secondary | ICD-10-CM | POA: Diagnosis not present

## 2020-11-18 DIAGNOSIS — Z6834 Body mass index (BMI) 34.0-34.9, adult: Secondary | ICD-10-CM | POA: Diagnosis not present

## 2020-11-18 DIAGNOSIS — I10 Essential (primary) hypertension: Secondary | ICD-10-CM | POA: Diagnosis not present

## 2020-11-18 DIAGNOSIS — Z299 Encounter for prophylactic measures, unspecified: Secondary | ICD-10-CM | POA: Diagnosis not present

## 2020-11-18 DIAGNOSIS — E1165 Type 2 diabetes mellitus with hyperglycemia: Secondary | ICD-10-CM | POA: Diagnosis not present

## 2020-11-18 DIAGNOSIS — G4733 Obstructive sleep apnea (adult) (pediatric): Secondary | ICD-10-CM | POA: Diagnosis not present

## 2020-12-03 DIAGNOSIS — M5416 Radiculopathy, lumbar region: Secondary | ICD-10-CM | POA: Diagnosis not present

## 2020-12-03 DIAGNOSIS — M48062 Spinal stenosis, lumbar region with neurogenic claudication: Secondary | ICD-10-CM | POA: Diagnosis not present

## 2020-12-03 DIAGNOSIS — M545 Low back pain, unspecified: Secondary | ICD-10-CM | POA: Diagnosis not present

## 2020-12-18 DIAGNOSIS — E798 Other disorders of purine and pyrimidine metabolism: Secondary | ICD-10-CM | POA: Diagnosis not present

## 2020-12-18 DIAGNOSIS — I10 Essential (primary) hypertension: Secondary | ICD-10-CM | POA: Diagnosis not present

## 2020-12-18 DIAGNOSIS — E1165 Type 2 diabetes mellitus with hyperglycemia: Secondary | ICD-10-CM | POA: Diagnosis not present

## 2020-12-27 DIAGNOSIS — U071 COVID-19: Secondary | ICD-10-CM | POA: Diagnosis not present

## 2020-12-27 DIAGNOSIS — Z299 Encounter for prophylactic measures, unspecified: Secondary | ICD-10-CM | POA: Diagnosis not present

## 2020-12-27 DIAGNOSIS — Z87891 Personal history of nicotine dependence: Secondary | ICD-10-CM | POA: Diagnosis not present

## 2021-01-14 DIAGNOSIS — E1151 Type 2 diabetes mellitus with diabetic peripheral angiopathy without gangrene: Secondary | ICD-10-CM | POA: Diagnosis not present

## 2021-01-14 DIAGNOSIS — E1140 Type 2 diabetes mellitus with diabetic neuropathy, unspecified: Secondary | ICD-10-CM | POA: Diagnosis not present

## 2021-01-16 DIAGNOSIS — I2510 Atherosclerotic heart disease of native coronary artery without angina pectoris: Secondary | ICD-10-CM | POA: Diagnosis not present

## 2021-01-16 DIAGNOSIS — R002 Palpitations: Secondary | ICD-10-CM | POA: Diagnosis not present

## 2021-01-16 DIAGNOSIS — G4730 Sleep apnea, unspecified: Secondary | ICD-10-CM | POA: Diagnosis not present

## 2021-01-16 DIAGNOSIS — E119 Type 2 diabetes mellitus without complications: Secondary | ICD-10-CM | POA: Diagnosis not present

## 2021-01-16 DIAGNOSIS — R55 Syncope and collapse: Secondary | ICD-10-CM | POA: Diagnosis not present

## 2021-01-16 DIAGNOSIS — R0600 Dyspnea, unspecified: Secondary | ICD-10-CM | POA: Diagnosis not present

## 2021-01-16 DIAGNOSIS — I10 Essential (primary) hypertension: Secondary | ICD-10-CM | POA: Diagnosis not present

## 2021-02-19 DIAGNOSIS — L820 Inflamed seborrheic keratosis: Secondary | ICD-10-CM | POA: Diagnosis not present

## 2021-02-19 DIAGNOSIS — L209 Atopic dermatitis, unspecified: Secondary | ICD-10-CM | POA: Diagnosis not present

## 2021-02-19 DIAGNOSIS — L821 Other seborrheic keratosis: Secondary | ICD-10-CM | POA: Diagnosis not present

## 2021-02-19 DIAGNOSIS — L570 Actinic keratosis: Secondary | ICD-10-CM | POA: Diagnosis not present

## 2021-02-19 DIAGNOSIS — S2096XA Insect bite (nonvenomous) of unspecified parts of thorax, initial encounter: Secondary | ICD-10-CM | POA: Diagnosis not present

## 2021-02-19 DIAGNOSIS — Z85828 Personal history of other malignant neoplasm of skin: Secondary | ICD-10-CM | POA: Diagnosis not present

## 2021-03-07 DIAGNOSIS — Z6834 Body mass index (BMI) 34.0-34.9, adult: Secondary | ICD-10-CM | POA: Diagnosis not present

## 2021-03-07 DIAGNOSIS — M549 Dorsalgia, unspecified: Secondary | ICD-10-CM | POA: Diagnosis not present

## 2021-03-07 DIAGNOSIS — I10 Essential (primary) hypertension: Secondary | ICD-10-CM | POA: Diagnosis not present

## 2021-03-07 DIAGNOSIS — E1165 Type 2 diabetes mellitus with hyperglycemia: Secondary | ICD-10-CM | POA: Diagnosis not present

## 2021-03-07 DIAGNOSIS — Z299 Encounter for prophylactic measures, unspecified: Secondary | ICD-10-CM | POA: Diagnosis not present

## 2021-03-12 DIAGNOSIS — M48062 Spinal stenosis, lumbar region with neurogenic claudication: Secondary | ICD-10-CM | POA: Diagnosis not present

## 2021-04-10 DIAGNOSIS — E1140 Type 2 diabetes mellitus with diabetic neuropathy, unspecified: Secondary | ICD-10-CM | POA: Diagnosis not present

## 2021-04-10 DIAGNOSIS — E1151 Type 2 diabetes mellitus with diabetic peripheral angiopathy without gangrene: Secondary | ICD-10-CM | POA: Diagnosis not present

## 2021-05-12 DIAGNOSIS — Z0000 Encounter for general adult medical examination without abnormal findings: Secondary | ICD-10-CM | POA: Diagnosis not present

## 2021-05-12 DIAGNOSIS — R5383 Other fatigue: Secondary | ICD-10-CM | POA: Diagnosis not present

## 2021-05-12 DIAGNOSIS — Z6832 Body mass index (BMI) 32.0-32.9, adult: Secondary | ICD-10-CM | POA: Diagnosis not present

## 2021-05-12 DIAGNOSIS — Z125 Encounter for screening for malignant neoplasm of prostate: Secondary | ICD-10-CM | POA: Diagnosis not present

## 2021-05-12 DIAGNOSIS — E7800 Pure hypercholesterolemia, unspecified: Secondary | ICD-10-CM | POA: Diagnosis not present

## 2021-05-12 DIAGNOSIS — Z1339 Encounter for screening examination for other mental health and behavioral disorders: Secondary | ICD-10-CM | POA: Diagnosis not present

## 2021-05-12 DIAGNOSIS — Z87891 Personal history of nicotine dependence: Secondary | ICD-10-CM | POA: Diagnosis not present

## 2021-05-12 DIAGNOSIS — Z7189 Other specified counseling: Secondary | ICD-10-CM | POA: Diagnosis not present

## 2021-05-12 DIAGNOSIS — Z1331 Encounter for screening for depression: Secondary | ICD-10-CM | POA: Diagnosis not present

## 2021-05-12 DIAGNOSIS — I10 Essential (primary) hypertension: Secondary | ICD-10-CM | POA: Diagnosis not present

## 2021-05-12 DIAGNOSIS — Z79899 Other long term (current) drug therapy: Secondary | ICD-10-CM | POA: Diagnosis not present

## 2021-05-12 DIAGNOSIS — Z299 Encounter for prophylactic measures, unspecified: Secondary | ICD-10-CM | POA: Diagnosis not present

## 2021-05-19 DIAGNOSIS — J349 Unspecified disorder of nose and nasal sinuses: Secondary | ICD-10-CM | POA: Diagnosis not present

## 2021-05-20 DIAGNOSIS — E782 Mixed hyperlipidemia: Secondary | ICD-10-CM | POA: Diagnosis not present

## 2021-05-20 DIAGNOSIS — I10 Essential (primary) hypertension: Secondary | ICD-10-CM | POA: Diagnosis not present

## 2021-06-10 DIAGNOSIS — N200 Calculus of kidney: Secondary | ICD-10-CM | POA: Diagnosis not present

## 2021-06-10 DIAGNOSIS — N138 Other obstructive and reflux uropathy: Secondary | ICD-10-CM | POA: Diagnosis not present

## 2021-06-10 DIAGNOSIS — N401 Enlarged prostate with lower urinary tract symptoms: Secondary | ICD-10-CM | POA: Diagnosis not present

## 2021-06-13 DIAGNOSIS — Z299 Encounter for prophylactic measures, unspecified: Secondary | ICD-10-CM | POA: Diagnosis not present

## 2021-06-13 DIAGNOSIS — Z6832 Body mass index (BMI) 32.0-32.9, adult: Secondary | ICD-10-CM | POA: Diagnosis not present

## 2021-06-13 DIAGNOSIS — I10 Essential (primary) hypertension: Secondary | ICD-10-CM | POA: Diagnosis not present

## 2021-06-13 DIAGNOSIS — Z87891 Personal history of nicotine dependence: Secondary | ICD-10-CM | POA: Diagnosis not present

## 2021-06-13 DIAGNOSIS — E1165 Type 2 diabetes mellitus with hyperglycemia: Secondary | ICD-10-CM | POA: Diagnosis not present

## 2021-06-13 DIAGNOSIS — I2510 Atherosclerotic heart disease of native coronary artery without angina pectoris: Secondary | ICD-10-CM | POA: Diagnosis not present

## 2021-06-19 DIAGNOSIS — E1140 Type 2 diabetes mellitus with diabetic neuropathy, unspecified: Secondary | ICD-10-CM | POA: Diagnosis not present

## 2021-06-19 DIAGNOSIS — E1151 Type 2 diabetes mellitus with diabetic peripheral angiopathy without gangrene: Secondary | ICD-10-CM | POA: Diagnosis not present

## 2021-07-07 DIAGNOSIS — H04123 Dry eye syndrome of bilateral lacrimal glands: Secondary | ICD-10-CM | POA: Diagnosis not present

## 2021-07-07 DIAGNOSIS — H35373 Puckering of macula, bilateral: Secondary | ICD-10-CM | POA: Diagnosis not present

## 2021-07-07 DIAGNOSIS — E119 Type 2 diabetes mellitus without complications: Secondary | ICD-10-CM | POA: Diagnosis not present

## 2021-07-07 DIAGNOSIS — H1045 Other chronic allergic conjunctivitis: Secondary | ICD-10-CM | POA: Diagnosis not present

## 2021-07-07 DIAGNOSIS — Z961 Presence of intraocular lens: Secondary | ICD-10-CM | POA: Diagnosis not present

## 2021-07-07 DIAGNOSIS — D3132 Benign neoplasm of left choroid: Secondary | ICD-10-CM | POA: Diagnosis not present

## 2021-07-07 DIAGNOSIS — H3540 Unspecified peripheral retinal degeneration: Secondary | ICD-10-CM | POA: Diagnosis not present

## 2021-07-07 DIAGNOSIS — H524 Presbyopia: Secondary | ICD-10-CM | POA: Diagnosis not present

## 2021-07-11 DIAGNOSIS — Z299 Encounter for prophylactic measures, unspecified: Secondary | ICD-10-CM | POA: Diagnosis not present

## 2021-07-11 DIAGNOSIS — I10 Essential (primary) hypertension: Secondary | ICD-10-CM | POA: Diagnosis not present

## 2021-07-11 DIAGNOSIS — Z6832 Body mass index (BMI) 32.0-32.9, adult: Secondary | ICD-10-CM | POA: Diagnosis not present

## 2021-07-11 DIAGNOSIS — E1165 Type 2 diabetes mellitus with hyperglycemia: Secondary | ICD-10-CM | POA: Diagnosis not present

## 2021-07-11 DIAGNOSIS — Z713 Dietary counseling and surveillance: Secondary | ICD-10-CM | POA: Diagnosis not present

## 2021-07-15 DIAGNOSIS — I10 Essential (primary) hypertension: Secondary | ICD-10-CM | POA: Diagnosis not present

## 2021-07-15 DIAGNOSIS — R0600 Dyspnea, unspecified: Secondary | ICD-10-CM | POA: Diagnosis not present

## 2021-07-15 DIAGNOSIS — D689 Coagulation defect, unspecified: Secondary | ICD-10-CM | POA: Diagnosis not present

## 2021-07-15 DIAGNOSIS — G4730 Sleep apnea, unspecified: Secondary | ICD-10-CM | POA: Diagnosis not present

## 2021-07-15 DIAGNOSIS — I4510 Unspecified right bundle-branch block: Secondary | ICD-10-CM | POA: Diagnosis not present

## 2021-07-15 DIAGNOSIS — Z6831 Body mass index (BMI) 31.0-31.9, adult: Secondary | ICD-10-CM | POA: Diagnosis not present

## 2021-07-15 DIAGNOSIS — R079 Chest pain, unspecified: Secondary | ICD-10-CM | POA: Diagnosis not present

## 2021-07-15 DIAGNOSIS — I2510 Atherosclerotic heart disease of native coronary artery without angina pectoris: Secondary | ICD-10-CM | POA: Diagnosis not present

## 2021-07-15 DIAGNOSIS — R002 Palpitations: Secondary | ICD-10-CM | POA: Diagnosis not present

## 2021-07-15 DIAGNOSIS — E119 Type 2 diabetes mellitus without complications: Secondary | ICD-10-CM | POA: Diagnosis not present

## 2021-07-15 DIAGNOSIS — R55 Syncope and collapse: Secondary | ICD-10-CM | POA: Diagnosis not present

## 2021-07-16 DIAGNOSIS — M65331 Trigger finger, right middle finger: Secondary | ICD-10-CM | POA: Diagnosis not present

## 2021-07-17 DIAGNOSIS — I10 Essential (primary) hypertension: Secondary | ICD-10-CM | POA: Diagnosis not present

## 2021-07-17 DIAGNOSIS — E782 Mixed hyperlipidemia: Secondary | ICD-10-CM | POA: Diagnosis not present

## 2021-07-21 DIAGNOSIS — R9439 Abnormal result of other cardiovascular function study: Secondary | ICD-10-CM | POA: Diagnosis not present

## 2021-07-21 DIAGNOSIS — Z7982 Long term (current) use of aspirin: Secondary | ICD-10-CM | POA: Diagnosis not present

## 2021-07-21 DIAGNOSIS — Z955 Presence of coronary angioplasty implant and graft: Secondary | ICD-10-CM | POA: Diagnosis not present

## 2021-07-21 DIAGNOSIS — Z87891 Personal history of nicotine dependence: Secondary | ICD-10-CM | POA: Diagnosis not present

## 2021-07-21 DIAGNOSIS — I10 Essential (primary) hypertension: Secondary | ICD-10-CM | POA: Diagnosis not present

## 2021-07-21 DIAGNOSIS — R0789 Other chest pain: Secondary | ICD-10-CM | POA: Diagnosis not present

## 2021-07-21 DIAGNOSIS — T82855A Stenosis of coronary artery stent, initial encounter: Secondary | ICD-10-CM | POA: Diagnosis not present

## 2021-07-21 DIAGNOSIS — I4510 Unspecified right bundle-branch block: Secondary | ICD-10-CM | POA: Diagnosis not present

## 2021-07-21 DIAGNOSIS — E119 Type 2 diabetes mellitus without complications: Secondary | ICD-10-CM | POA: Diagnosis not present

## 2021-07-21 DIAGNOSIS — D689 Coagulation defect, unspecified: Secondary | ICD-10-CM | POA: Diagnosis not present

## 2021-07-21 DIAGNOSIS — I2510 Atherosclerotic heart disease of native coronary artery without angina pectoris: Secondary | ICD-10-CM | POA: Diagnosis not present

## 2021-07-21 DIAGNOSIS — R0609 Other forms of dyspnea: Secondary | ICD-10-CM | POA: Diagnosis not present

## 2021-07-21 DIAGNOSIS — G4730 Sleep apnea, unspecified: Secondary | ICD-10-CM | POA: Diagnosis not present

## 2021-07-28 DIAGNOSIS — S0181XA Laceration without foreign body of other part of head, initial encounter: Secondary | ICD-10-CM | POA: Diagnosis not present

## 2021-07-28 DIAGNOSIS — Z794 Long term (current) use of insulin: Secondary | ICD-10-CM | POA: Diagnosis not present

## 2021-07-28 DIAGNOSIS — R519 Headache, unspecified: Secondary | ICD-10-CM | POA: Diagnosis not present

## 2021-07-28 DIAGNOSIS — I10 Essential (primary) hypertension: Secondary | ICD-10-CM | POA: Diagnosis not present

## 2021-07-28 DIAGNOSIS — I2510 Atherosclerotic heart disease of native coronary artery without angina pectoris: Secondary | ICD-10-CM | POA: Diagnosis not present

## 2021-07-28 DIAGNOSIS — M542 Cervicalgia: Secondary | ICD-10-CM | POA: Diagnosis not present

## 2021-07-28 DIAGNOSIS — S40012A Contusion of left shoulder, initial encounter: Secondary | ICD-10-CM | POA: Diagnosis not present

## 2021-07-28 DIAGNOSIS — M25512 Pain in left shoulder: Secondary | ICD-10-CM | POA: Diagnosis not present

## 2021-07-28 DIAGNOSIS — S0990XA Unspecified injury of head, initial encounter: Secondary | ICD-10-CM | POA: Diagnosis not present

## 2021-07-28 DIAGNOSIS — W0110XA Fall on same level from slipping, tripping and stumbling with subsequent striking against unspecified object, initial encounter: Secondary | ICD-10-CM | POA: Diagnosis not present

## 2021-07-28 DIAGNOSIS — S134XXA Sprain of ligaments of cervical spine, initial encounter: Secondary | ICD-10-CM | POA: Diagnosis not present

## 2021-07-28 DIAGNOSIS — S81812A Laceration without foreign body, left lower leg, initial encounter: Secondary | ICD-10-CM | POA: Diagnosis not present

## 2021-07-28 DIAGNOSIS — E119 Type 2 diabetes mellitus without complications: Secondary | ICD-10-CM | POA: Diagnosis not present

## 2021-08-05 DIAGNOSIS — I10 Essential (primary) hypertension: Secondary | ICD-10-CM | POA: Diagnosis not present

## 2021-08-05 DIAGNOSIS — Z299 Encounter for prophylactic measures, unspecified: Secondary | ICD-10-CM | POA: Diagnosis not present

## 2021-08-05 DIAGNOSIS — Z713 Dietary counseling and surveillance: Secondary | ICD-10-CM | POA: Diagnosis not present

## 2021-08-05 DIAGNOSIS — S0181XA Laceration without foreign body of other part of head, initial encounter: Secondary | ICD-10-CM | POA: Diagnosis not present

## 2021-08-05 DIAGNOSIS — Z6832 Body mass index (BMI) 32.0-32.9, adult: Secondary | ICD-10-CM | POA: Diagnosis not present

## 2021-08-18 DIAGNOSIS — Z85828 Personal history of other malignant neoplasm of skin: Secondary | ICD-10-CM | POA: Diagnosis not present

## 2021-08-18 DIAGNOSIS — L570 Actinic keratosis: Secondary | ICD-10-CM | POA: Diagnosis not present

## 2021-08-18 DIAGNOSIS — L821 Other seborrheic keratosis: Secondary | ICD-10-CM | POA: Diagnosis not present

## 2021-08-18 DIAGNOSIS — D692 Other nonthrombocytopenic purpura: Secondary | ICD-10-CM | POA: Diagnosis not present

## 2021-08-18 DIAGNOSIS — L209 Atopic dermatitis, unspecified: Secondary | ICD-10-CM | POA: Diagnosis not present

## 2021-08-18 DIAGNOSIS — L905 Scar conditions and fibrosis of skin: Secondary | ICD-10-CM | POA: Diagnosis not present

## 2021-08-28 DIAGNOSIS — E1140 Type 2 diabetes mellitus with diabetic neuropathy, unspecified: Secondary | ICD-10-CM | POA: Diagnosis not present

## 2021-08-28 DIAGNOSIS — E1151 Type 2 diabetes mellitus with diabetic peripheral angiopathy without gangrene: Secondary | ICD-10-CM | POA: Diagnosis not present

## 2021-09-08 DIAGNOSIS — W57XXXA Bitten or stung by nonvenomous insect and other nonvenomous arthropods, initial encounter: Secondary | ICD-10-CM | POA: Diagnosis not present

## 2021-09-08 DIAGNOSIS — S30861A Insect bite (nonvenomous) of abdominal wall, initial encounter: Secondary | ICD-10-CM | POA: Diagnosis not present

## 2021-09-10 DIAGNOSIS — R58 Hemorrhage, not elsewhere classified: Secondary | ICD-10-CM | POA: Diagnosis not present

## 2021-09-10 DIAGNOSIS — W19XXXA Unspecified fall, initial encounter: Secondary | ICD-10-CM | POA: Diagnosis not present

## 2021-09-10 DIAGNOSIS — S0101XA Laceration without foreign body of scalp, initial encounter: Secondary | ICD-10-CM | POA: Diagnosis not present

## 2021-09-10 DIAGNOSIS — R55 Syncope and collapse: Secondary | ICD-10-CM | POA: Diagnosis not present

## 2021-09-10 DIAGNOSIS — S0990XA Unspecified injury of head, initial encounter: Secondary | ICD-10-CM | POA: Diagnosis not present

## 2021-09-10 DIAGNOSIS — S134XXA Sprain of ligaments of cervical spine, initial encounter: Secondary | ICD-10-CM | POA: Diagnosis not present

## 2021-09-10 DIAGNOSIS — I959 Hypotension, unspecified: Secondary | ICD-10-CM | POA: Diagnosis not present

## 2021-09-10 DIAGNOSIS — M542 Cervicalgia: Secondary | ICD-10-CM | POA: Diagnosis not present

## 2021-09-16 DIAGNOSIS — I10 Essential (primary) hypertension: Secondary | ICD-10-CM | POA: Diagnosis not present

## 2021-09-16 DIAGNOSIS — S0181XS Laceration without foreign body of other part of head, sequela: Secondary | ICD-10-CM | POA: Diagnosis not present

## 2021-09-16 DIAGNOSIS — G4730 Sleep apnea, unspecified: Secondary | ICD-10-CM | POA: Diagnosis not present

## 2021-09-16 DIAGNOSIS — Z299 Encounter for prophylactic measures, unspecified: Secondary | ICD-10-CM | POA: Diagnosis not present

## 2021-09-16 DIAGNOSIS — R079 Chest pain, unspecified: Secondary | ICD-10-CM | POA: Diagnosis not present

## 2021-09-16 DIAGNOSIS — E119 Type 2 diabetes mellitus without complications: Secondary | ICD-10-CM | POA: Diagnosis not present

## 2021-09-16 DIAGNOSIS — E1165 Type 2 diabetes mellitus with hyperglycemia: Secondary | ICD-10-CM | POA: Diagnosis not present

## 2021-09-16 DIAGNOSIS — R0600 Dyspnea, unspecified: Secondary | ICD-10-CM | POA: Diagnosis not present

## 2021-09-16 DIAGNOSIS — R002 Palpitations: Secondary | ICD-10-CM | POA: Diagnosis not present

## 2021-09-16 DIAGNOSIS — Z6831 Body mass index (BMI) 31.0-31.9, adult: Secondary | ICD-10-CM | POA: Diagnosis not present

## 2021-09-16 DIAGNOSIS — S01111S Laceration without foreign body of right eyelid and periocular area, sequela: Secondary | ICD-10-CM | POA: Diagnosis not present

## 2021-09-16 DIAGNOSIS — I951 Orthostatic hypotension: Secondary | ICD-10-CM | POA: Diagnosis not present

## 2021-09-16 DIAGNOSIS — I4510 Unspecified right bundle-branch block: Secondary | ICD-10-CM | POA: Diagnosis not present

## 2021-09-16 DIAGNOSIS — I2510 Atherosclerotic heart disease of native coronary artery without angina pectoris: Secondary | ICD-10-CM | POA: Diagnosis not present

## 2021-09-16 DIAGNOSIS — R55 Syncope and collapse: Secondary | ICD-10-CM | POA: Diagnosis not present

## 2021-09-23 DIAGNOSIS — Z299 Encounter for prophylactic measures, unspecified: Secondary | ICD-10-CM | POA: Diagnosis not present

## 2021-09-23 DIAGNOSIS — Z713 Dietary counseling and surveillance: Secondary | ICD-10-CM | POA: Diagnosis not present

## 2021-09-23 DIAGNOSIS — I10 Essential (primary) hypertension: Secondary | ICD-10-CM | POA: Diagnosis not present

## 2021-09-23 DIAGNOSIS — Z4802 Encounter for removal of sutures: Secondary | ICD-10-CM | POA: Diagnosis not present

## 2021-09-23 DIAGNOSIS — Z6831 Body mass index (BMI) 31.0-31.9, adult: Secondary | ICD-10-CM | POA: Diagnosis not present

## 2021-10-17 DIAGNOSIS — I10 Essential (primary) hypertension: Secondary | ICD-10-CM | POA: Diagnosis not present

## 2021-10-17 DIAGNOSIS — E1165 Type 2 diabetes mellitus with hyperglycemia: Secondary | ICD-10-CM | POA: Diagnosis not present

## 2021-10-17 DIAGNOSIS — E785 Hyperlipidemia, unspecified: Secondary | ICD-10-CM | POA: Diagnosis not present

## 2021-11-06 DIAGNOSIS — E1151 Type 2 diabetes mellitus with diabetic peripheral angiopathy without gangrene: Secondary | ICD-10-CM | POA: Diagnosis not present

## 2021-11-06 DIAGNOSIS — E1140 Type 2 diabetes mellitus with diabetic neuropathy, unspecified: Secondary | ICD-10-CM | POA: Diagnosis not present

## 2021-12-18 DIAGNOSIS — G4730 Sleep apnea, unspecified: Secondary | ICD-10-CM | POA: Diagnosis not present

## 2021-12-18 DIAGNOSIS — E119 Type 2 diabetes mellitus without complications: Secondary | ICD-10-CM | POA: Diagnosis not present

## 2021-12-18 DIAGNOSIS — R0600 Dyspnea, unspecified: Secondary | ICD-10-CM | POA: Diagnosis not present

## 2021-12-18 DIAGNOSIS — R002 Palpitations: Secondary | ICD-10-CM | POA: Diagnosis not present

## 2021-12-18 DIAGNOSIS — R55 Syncope and collapse: Secondary | ICD-10-CM | POA: Diagnosis not present

## 2021-12-18 DIAGNOSIS — I2510 Atherosclerotic heart disease of native coronary artery without angina pectoris: Secondary | ICD-10-CM | POA: Diagnosis not present

## 2021-12-18 DIAGNOSIS — Z6831 Body mass index (BMI) 31.0-31.9, adult: Secondary | ICD-10-CM | POA: Diagnosis not present

## 2021-12-18 DIAGNOSIS — I4510 Unspecified right bundle-branch block: Secondary | ICD-10-CM | POA: Diagnosis not present

## 2021-12-18 DIAGNOSIS — R079 Chest pain, unspecified: Secondary | ICD-10-CM | POA: Diagnosis not present

## 2021-12-18 DIAGNOSIS — I10 Essential (primary) hypertension: Secondary | ICD-10-CM | POA: Diagnosis not present

## 2021-12-19 DIAGNOSIS — I10 Essential (primary) hypertension: Secondary | ICD-10-CM | POA: Diagnosis not present

## 2021-12-19 DIAGNOSIS — Z6831 Body mass index (BMI) 31.0-31.9, adult: Secondary | ICD-10-CM | POA: Diagnosis not present

## 2021-12-19 DIAGNOSIS — Z299 Encounter for prophylactic measures, unspecified: Secondary | ICD-10-CM | POA: Diagnosis not present

## 2021-12-19 DIAGNOSIS — E1165 Type 2 diabetes mellitus with hyperglycemia: Secondary | ICD-10-CM | POA: Diagnosis not present

## 2021-12-19 DIAGNOSIS — Z713 Dietary counseling and surveillance: Secondary | ICD-10-CM | POA: Diagnosis not present

## 2021-12-19 DIAGNOSIS — Z23 Encounter for immunization: Secondary | ICD-10-CM | POA: Diagnosis not present

## 2021-12-23 DIAGNOSIS — I2510 Atherosclerotic heart disease of native coronary artery without angina pectoris: Secondary | ICD-10-CM | POA: Diagnosis not present

## 2022-01-19 DIAGNOSIS — E1151 Type 2 diabetes mellitus with diabetic peripheral angiopathy without gangrene: Secondary | ICD-10-CM | POA: Diagnosis not present

## 2022-01-19 DIAGNOSIS — E1140 Type 2 diabetes mellitus with diabetic neuropathy, unspecified: Secondary | ICD-10-CM | POA: Diagnosis not present

## 2022-01-22 DIAGNOSIS — Z7982 Long term (current) use of aspirin: Secondary | ICD-10-CM | POA: Diagnosis not present

## 2022-01-22 DIAGNOSIS — Z791 Long term (current) use of non-steroidal anti-inflammatories (NSAID): Secondary | ICD-10-CM | POA: Diagnosis not present

## 2022-01-22 DIAGNOSIS — M5416 Radiculopathy, lumbar region: Secondary | ICD-10-CM | POA: Diagnosis not present

## 2022-01-22 DIAGNOSIS — M48062 Spinal stenosis, lumbar region with neurogenic claudication: Secondary | ICD-10-CM | POA: Diagnosis not present

## 2022-03-03 DIAGNOSIS — Z85828 Personal history of other malignant neoplasm of skin: Secondary | ICD-10-CM | POA: Diagnosis not present

## 2022-03-03 DIAGNOSIS — L821 Other seborrheic keratosis: Secondary | ICD-10-CM | POA: Diagnosis not present

## 2022-03-03 DIAGNOSIS — L853 Xerosis cutis: Secondary | ICD-10-CM | POA: Diagnosis not present

## 2022-03-03 DIAGNOSIS — L570 Actinic keratosis: Secondary | ICD-10-CM | POA: Diagnosis not present

## 2022-03-25 DIAGNOSIS — Z299 Encounter for prophylactic measures, unspecified: Secondary | ICD-10-CM | POA: Diagnosis not present

## 2022-03-25 DIAGNOSIS — G4733 Obstructive sleep apnea (adult) (pediatric): Secondary | ICD-10-CM | POA: Diagnosis not present

## 2022-03-25 DIAGNOSIS — Z1211 Encounter for screening for malignant neoplasm of colon: Secondary | ICD-10-CM | POA: Diagnosis not present

## 2022-03-25 DIAGNOSIS — Z6831 Body mass index (BMI) 31.0-31.9, adult: Secondary | ICD-10-CM | POA: Diagnosis not present

## 2022-03-25 DIAGNOSIS — I10 Essential (primary) hypertension: Secondary | ICD-10-CM | POA: Diagnosis not present

## 2022-03-25 DIAGNOSIS — E1165 Type 2 diabetes mellitus with hyperglycemia: Secondary | ICD-10-CM | POA: Diagnosis not present

## 2022-03-31 DIAGNOSIS — E1151 Type 2 diabetes mellitus with diabetic peripheral angiopathy without gangrene: Secondary | ICD-10-CM | POA: Diagnosis not present

## 2022-03-31 DIAGNOSIS — E1140 Type 2 diabetes mellitus with diabetic neuropathy, unspecified: Secondary | ICD-10-CM | POA: Diagnosis not present

## 2022-04-16 DIAGNOSIS — M5416 Radiculopathy, lumbar region: Secondary | ICD-10-CM | POA: Diagnosis not present

## 2022-04-16 DIAGNOSIS — Z79899 Other long term (current) drug therapy: Secondary | ICD-10-CM | POA: Diagnosis not present

## 2022-05-04 DIAGNOSIS — N200 Calculus of kidney: Secondary | ICD-10-CM | POA: Diagnosis not present

## 2022-05-04 DIAGNOSIS — F5221 Male erectile disorder: Secondary | ICD-10-CM | POA: Diagnosis not present

## 2022-05-04 DIAGNOSIS — N138 Other obstructive and reflux uropathy: Secondary | ICD-10-CM | POA: Diagnosis not present

## 2022-05-04 DIAGNOSIS — N401 Enlarged prostate with lower urinary tract symptoms: Secondary | ICD-10-CM | POA: Diagnosis not present

## 2022-05-07 DIAGNOSIS — R972 Elevated prostate specific antigen [PSA]: Secondary | ICD-10-CM | POA: Diagnosis not present

## 2022-05-13 DIAGNOSIS — I10 Essential (primary) hypertension: Secondary | ICD-10-CM | POA: Diagnosis not present

## 2022-05-13 DIAGNOSIS — E7800 Pure hypercholesterolemia, unspecified: Secondary | ICD-10-CM | POA: Diagnosis not present

## 2022-05-13 DIAGNOSIS — Z299 Encounter for prophylactic measures, unspecified: Secondary | ICD-10-CM | POA: Diagnosis not present

## 2022-05-13 DIAGNOSIS — R5383 Other fatigue: Secondary | ICD-10-CM | POA: Diagnosis not present

## 2022-05-13 DIAGNOSIS — Z79899 Other long term (current) drug therapy: Secondary | ICD-10-CM | POA: Diagnosis not present

## 2022-05-13 DIAGNOSIS — Z0000 Encounter for general adult medical examination without abnormal findings: Secondary | ICD-10-CM | POA: Diagnosis not present

## 2022-05-13 DIAGNOSIS — Z7189 Other specified counseling: Secondary | ICD-10-CM | POA: Diagnosis not present

## 2022-05-13 DIAGNOSIS — Z1339 Encounter for screening examination for other mental health and behavioral disorders: Secondary | ICD-10-CM | POA: Diagnosis not present

## 2022-05-13 DIAGNOSIS — Z1331 Encounter for screening for depression: Secondary | ICD-10-CM | POA: Diagnosis not present

## 2022-05-13 DIAGNOSIS — Z6831 Body mass index (BMI) 31.0-31.9, adult: Secondary | ICD-10-CM | POA: Diagnosis not present

## 2022-05-13 DIAGNOSIS — Z125 Encounter for screening for malignant neoplasm of prostate: Secondary | ICD-10-CM | POA: Diagnosis not present

## 2022-05-20 DIAGNOSIS — M5033 Other cervical disc degeneration, cervicothoracic region: Secondary | ICD-10-CM | POA: Diagnosis not present

## 2022-05-20 DIAGNOSIS — M25474 Effusion, right foot: Secondary | ICD-10-CM | POA: Diagnosis not present

## 2022-05-20 DIAGNOSIS — C61 Malignant neoplasm of prostate: Secondary | ICD-10-CM | POA: Diagnosis not present

## 2022-05-20 DIAGNOSIS — M47813 Spondylosis without myelopathy or radiculopathy, cervicothoracic region: Secondary | ICD-10-CM | POA: Diagnosis not present

## 2022-05-20 DIAGNOSIS — D291 Benign neoplasm of prostate: Secondary | ICD-10-CM | POA: Diagnosis not present

## 2022-05-20 DIAGNOSIS — R972 Elevated prostate specific antigen [PSA]: Secondary | ICD-10-CM | POA: Diagnosis not present

## 2022-05-29 DIAGNOSIS — R972 Elevated prostate specific antigen [PSA]: Secondary | ICD-10-CM | POA: Diagnosis not present

## 2022-05-29 DIAGNOSIS — R310 Gross hematuria: Secondary | ICD-10-CM | POA: Diagnosis not present

## 2022-06-09 DIAGNOSIS — E1151 Type 2 diabetes mellitus with diabetic peripheral angiopathy without gangrene: Secondary | ICD-10-CM | POA: Diagnosis not present

## 2022-06-09 DIAGNOSIS — E1140 Type 2 diabetes mellitus with diabetic neuropathy, unspecified: Secondary | ICD-10-CM | POA: Diagnosis not present

## 2022-06-22 DIAGNOSIS — G4730 Sleep apnea, unspecified: Secondary | ICD-10-CM | POA: Diagnosis not present

## 2022-06-22 DIAGNOSIS — E119 Type 2 diabetes mellitus without complications: Secondary | ICD-10-CM | POA: Diagnosis not present

## 2022-06-22 DIAGNOSIS — R0600 Dyspnea, unspecified: Secondary | ICD-10-CM | POA: Diagnosis not present

## 2022-06-22 DIAGNOSIS — R079 Chest pain, unspecified: Secondary | ICD-10-CM | POA: Diagnosis not present

## 2022-06-22 DIAGNOSIS — I4510 Unspecified right bundle-branch block: Secondary | ICD-10-CM | POA: Diagnosis not present

## 2022-06-22 DIAGNOSIS — R55 Syncope and collapse: Secondary | ICD-10-CM | POA: Diagnosis not present

## 2022-06-22 DIAGNOSIS — Z6831 Body mass index (BMI) 31.0-31.9, adult: Secondary | ICD-10-CM | POA: Diagnosis not present

## 2022-06-22 DIAGNOSIS — R002 Palpitations: Secondary | ICD-10-CM | POA: Diagnosis not present

## 2022-06-22 DIAGNOSIS — I2510 Atherosclerotic heart disease of native coronary artery without angina pectoris: Secondary | ICD-10-CM | POA: Diagnosis not present

## 2022-06-22 DIAGNOSIS — I10 Essential (primary) hypertension: Secondary | ICD-10-CM | POA: Diagnosis not present

## 2022-06-29 DIAGNOSIS — U071 COVID-19: Secondary | ICD-10-CM | POA: Diagnosis not present

## 2022-06-29 DIAGNOSIS — E1165 Type 2 diabetes mellitus with hyperglycemia: Secondary | ICD-10-CM | POA: Diagnosis not present

## 2022-07-09 DIAGNOSIS — D3132 Benign neoplasm of left choroid: Secondary | ICD-10-CM | POA: Diagnosis not present

## 2022-07-09 DIAGNOSIS — H1045 Other chronic allergic conjunctivitis: Secondary | ICD-10-CM | POA: Diagnosis not present

## 2022-07-09 DIAGNOSIS — E119 Type 2 diabetes mellitus without complications: Secondary | ICD-10-CM | POA: Diagnosis not present

## 2022-07-09 DIAGNOSIS — H35373 Puckering of macula, bilateral: Secondary | ICD-10-CM | POA: Diagnosis not present

## 2022-07-09 DIAGNOSIS — H04123 Dry eye syndrome of bilateral lacrimal glands: Secondary | ICD-10-CM | POA: Diagnosis not present

## 2022-07-09 DIAGNOSIS — Z961 Presence of intraocular lens: Secondary | ICD-10-CM | POA: Diagnosis not present

## 2022-07-09 DIAGNOSIS — H3540 Unspecified peripheral retinal degeneration: Secondary | ICD-10-CM | POA: Diagnosis not present

## 2022-07-09 DIAGNOSIS — H524 Presbyopia: Secondary | ICD-10-CM | POA: Diagnosis not present

## 2022-07-18 DIAGNOSIS — R911 Solitary pulmonary nodule: Secondary | ICD-10-CM | POA: Diagnosis not present

## 2022-07-18 DIAGNOSIS — R918 Other nonspecific abnormal finding of lung field: Secondary | ICD-10-CM | POA: Diagnosis not present

## 2022-07-18 DIAGNOSIS — R4702 Dysphasia: Secondary | ICD-10-CM | POA: Diagnosis not present

## 2022-07-23 DIAGNOSIS — M549 Dorsalgia, unspecified: Secondary | ICD-10-CM | POA: Diagnosis not present

## 2022-07-23 DIAGNOSIS — I10 Essential (primary) hypertension: Secondary | ICD-10-CM | POA: Diagnosis not present

## 2022-07-23 DIAGNOSIS — C61 Malignant neoplasm of prostate: Secondary | ICD-10-CM | POA: Diagnosis not present

## 2022-07-23 DIAGNOSIS — E1165 Type 2 diabetes mellitus with hyperglycemia: Secondary | ICD-10-CM | POA: Diagnosis not present

## 2022-07-23 DIAGNOSIS — Z299 Encounter for prophylactic measures, unspecified: Secondary | ICD-10-CM | POA: Diagnosis not present

## 2022-07-27 DIAGNOSIS — M4802 Spinal stenosis, cervical region: Secondary | ICD-10-CM | POA: Diagnosis not present

## 2022-07-30 DIAGNOSIS — M5416 Radiculopathy, lumbar region: Secondary | ICD-10-CM | POA: Diagnosis not present

## 2022-07-30 DIAGNOSIS — Z7982 Long term (current) use of aspirin: Secondary | ICD-10-CM | POA: Diagnosis not present

## 2022-08-03 DIAGNOSIS — R310 Gross hematuria: Secondary | ICD-10-CM | POA: Diagnosis not present

## 2022-08-03 DIAGNOSIS — R972 Elevated prostate specific antigen [PSA]: Secondary | ICD-10-CM | POA: Diagnosis not present

## 2022-08-03 DIAGNOSIS — N401 Enlarged prostate with lower urinary tract symptoms: Secondary | ICD-10-CM | POA: Diagnosis not present

## 2022-08-06 DIAGNOSIS — M5412 Radiculopathy, cervical region: Secondary | ICD-10-CM | POA: Diagnosis not present

## 2022-08-06 DIAGNOSIS — G061 Intraspinal abscess and granuloma: Secondary | ICD-10-CM | POA: Diagnosis not present

## 2022-08-06 DIAGNOSIS — M542 Cervicalgia: Secondary | ICD-10-CM | POA: Diagnosis not present

## 2022-08-18 DIAGNOSIS — E1140 Type 2 diabetes mellitus with diabetic neuropathy, unspecified: Secondary | ICD-10-CM | POA: Diagnosis not present

## 2022-08-18 DIAGNOSIS — E1151 Type 2 diabetes mellitus with diabetic peripheral angiopathy without gangrene: Secondary | ICD-10-CM | POA: Diagnosis not present

## 2022-08-31 DIAGNOSIS — D692 Other nonthrombocytopenic purpura: Secondary | ICD-10-CM | POA: Diagnosis not present

## 2022-08-31 DIAGNOSIS — L821 Other seborrheic keratosis: Secondary | ICD-10-CM | POA: Diagnosis not present

## 2022-08-31 DIAGNOSIS — L853 Xerosis cutis: Secondary | ICD-10-CM | POA: Diagnosis not present

## 2022-08-31 DIAGNOSIS — Z85828 Personal history of other malignant neoplasm of skin: Secondary | ICD-10-CM | POA: Diagnosis not present

## 2022-08-31 DIAGNOSIS — L905 Scar conditions and fibrosis of skin: Secondary | ICD-10-CM | POA: Diagnosis not present

## 2022-09-22 DIAGNOSIS — M25531 Pain in right wrist: Secondary | ICD-10-CM | POA: Diagnosis not present

## 2022-09-22 DIAGNOSIS — Z299 Encounter for prophylactic measures, unspecified: Secondary | ICD-10-CM | POA: Diagnosis not present

## 2022-09-22 DIAGNOSIS — M10031 Idiopathic gout, right wrist: Secondary | ICD-10-CM | POA: Diagnosis not present

## 2022-09-22 DIAGNOSIS — I10 Essential (primary) hypertension: Secondary | ICD-10-CM | POA: Diagnosis not present

## 2022-09-25 DIAGNOSIS — M25572 Pain in left ankle and joints of left foot: Secondary | ICD-10-CM | POA: Diagnosis not present

## 2022-09-25 DIAGNOSIS — S99912A Unspecified injury of left ankle, initial encounter: Secondary | ICD-10-CM | POA: Diagnosis not present

## 2022-09-25 DIAGNOSIS — R2242 Localized swelling, mass and lump, left lower limb: Secondary | ICD-10-CM | POA: Diagnosis not present

## 2022-10-04 DIAGNOSIS — Z743 Need for continuous supervision: Secondary | ICD-10-CM | POA: Diagnosis not present

## 2022-10-04 DIAGNOSIS — M79642 Pain in left hand: Secondary | ICD-10-CM | POA: Diagnosis not present

## 2022-10-04 DIAGNOSIS — S63286A Dislocation of proximal interphalangeal joint of right little finger, initial encounter: Secondary | ICD-10-CM | POA: Diagnosis not present

## 2022-10-04 DIAGNOSIS — S6992XA Unspecified injury of left wrist, hand and finger(s), initial encounter: Secondary | ICD-10-CM | POA: Diagnosis not present

## 2022-10-04 DIAGNOSIS — S62627A Displaced fracture of medial phalanx of left little finger, initial encounter for closed fracture: Secondary | ICD-10-CM | POA: Diagnosis not present

## 2022-10-08 DIAGNOSIS — M79671 Pain in right foot: Secondary | ICD-10-CM | POA: Diagnosis not present

## 2022-10-08 DIAGNOSIS — M79674 Pain in right toe(s): Secondary | ICD-10-CM | POA: Diagnosis not present

## 2022-10-08 DIAGNOSIS — L110 Acquired keratosis follicularis: Secondary | ICD-10-CM | POA: Diagnosis not present

## 2022-10-08 DIAGNOSIS — Q828 Other specified congenital malformations of skin: Secondary | ICD-10-CM | POA: Diagnosis not present

## 2022-10-15 DIAGNOSIS — S63253D Unspecified dislocation of left middle finger, subsequent encounter: Secondary | ICD-10-CM | POA: Diagnosis not present

## 2022-10-15 DIAGNOSIS — Z8781 Personal history of (healed) traumatic fracture: Secondary | ICD-10-CM | POA: Diagnosis not present

## 2022-10-26 DIAGNOSIS — E1165 Type 2 diabetes mellitus with hyperglycemia: Secondary | ICD-10-CM | POA: Diagnosis not present

## 2022-10-26 DIAGNOSIS — I10 Essential (primary) hypertension: Secondary | ICD-10-CM | POA: Diagnosis not present

## 2022-10-26 DIAGNOSIS — Z299 Encounter for prophylactic measures, unspecified: Secondary | ICD-10-CM | POA: Diagnosis not present

## 2022-10-26 DIAGNOSIS — C61 Malignant neoplasm of prostate: Secondary | ICD-10-CM | POA: Diagnosis not present

## 2022-10-27 DIAGNOSIS — E1140 Type 2 diabetes mellitus with diabetic neuropathy, unspecified: Secondary | ICD-10-CM | POA: Diagnosis not present

## 2022-10-27 DIAGNOSIS — E1151 Type 2 diabetes mellitus with diabetic peripheral angiopathy without gangrene: Secondary | ICD-10-CM | POA: Diagnosis not present

## 2022-11-02 DIAGNOSIS — R972 Elevated prostate specific antigen [PSA]: Secondary | ICD-10-CM | POA: Diagnosis not present

## 2022-11-09 DIAGNOSIS — C61 Malignant neoplasm of prostate: Secondary | ICD-10-CM | POA: Diagnosis not present

## 2022-11-09 DIAGNOSIS — N401 Enlarged prostate with lower urinary tract symptoms: Secondary | ICD-10-CM | POA: Diagnosis not present

## 2022-11-18 DIAGNOSIS — S63257D Unspecified dislocation of left little finger, subsequent encounter: Secondary | ICD-10-CM | POA: Diagnosis not present

## 2022-11-18 DIAGNOSIS — M65331 Trigger finger, right middle finger: Secondary | ICD-10-CM | POA: Diagnosis not present

## 2022-11-19 DIAGNOSIS — M5416 Radiculopathy, lumbar region: Secondary | ICD-10-CM | POA: Diagnosis not present

## 2022-11-20 DIAGNOSIS — M4807 Spinal stenosis, lumbosacral region: Secondary | ICD-10-CM | POA: Diagnosis not present

## 2022-11-20 DIAGNOSIS — M519 Unspecified thoracic, thoracolumbar and lumbosacral intervertebral disc disorder: Secondary | ICD-10-CM | POA: Diagnosis not present

## 2022-11-20 DIAGNOSIS — M48062 Spinal stenosis, lumbar region with neurogenic claudication: Secondary | ICD-10-CM | POA: Diagnosis not present

## 2022-11-20 DIAGNOSIS — M47816 Spondylosis without myelopathy or radiculopathy, lumbar region: Secondary | ICD-10-CM | POA: Diagnosis not present

## 2022-12-10 DIAGNOSIS — M79674 Pain in right toe(s): Secondary | ICD-10-CM | POA: Diagnosis not present

## 2022-12-10 DIAGNOSIS — E1140 Type 2 diabetes mellitus with diabetic neuropathy, unspecified: Secondary | ICD-10-CM | POA: Diagnosis not present

## 2022-12-10 DIAGNOSIS — M79671 Pain in right foot: Secondary | ICD-10-CM | POA: Diagnosis not present

## 2022-12-10 DIAGNOSIS — E1151 Type 2 diabetes mellitus with diabetic peripheral angiopathy without gangrene: Secondary | ICD-10-CM | POA: Diagnosis not present

## 2022-12-10 DIAGNOSIS — S90451A Superficial foreign body, right great toe, initial encounter: Secondary | ICD-10-CM | POA: Diagnosis not present

## 2023-01-08 DIAGNOSIS — R002 Palpitations: Secondary | ICD-10-CM | POA: Diagnosis not present

## 2023-01-08 DIAGNOSIS — Z6830 Body mass index (BMI) 30.0-30.9, adult: Secondary | ICD-10-CM | POA: Diagnosis not present

## 2023-01-08 DIAGNOSIS — R0600 Dyspnea, unspecified: Secondary | ICD-10-CM | POA: Diagnosis not present

## 2023-01-08 DIAGNOSIS — I10 Essential (primary) hypertension: Secondary | ICD-10-CM | POA: Diagnosis not present

## 2023-01-08 DIAGNOSIS — I4510 Unspecified right bundle-branch block: Secondary | ICD-10-CM | POA: Diagnosis not present

## 2023-01-08 DIAGNOSIS — R55 Syncope and collapse: Secondary | ICD-10-CM | POA: Diagnosis not present

## 2023-01-08 DIAGNOSIS — G4730 Sleep apnea, unspecified: Secondary | ICD-10-CM | POA: Diagnosis not present

## 2023-01-08 DIAGNOSIS — E119 Type 2 diabetes mellitus without complications: Secondary | ICD-10-CM | POA: Diagnosis not present

## 2023-01-08 DIAGNOSIS — R079 Chest pain, unspecified: Secondary | ICD-10-CM | POA: Diagnosis not present

## 2023-01-08 DIAGNOSIS — I2510 Atherosclerotic heart disease of native coronary artery without angina pectoris: Secondary | ICD-10-CM | POA: Diagnosis not present

## 2023-01-25 DIAGNOSIS — E1140 Type 2 diabetes mellitus with diabetic neuropathy, unspecified: Secondary | ICD-10-CM | POA: Diagnosis not present

## 2023-01-25 DIAGNOSIS — E1151 Type 2 diabetes mellitus with diabetic peripheral angiopathy without gangrene: Secondary | ICD-10-CM | POA: Diagnosis not present

## 2023-02-01 DIAGNOSIS — Z23 Encounter for immunization: Secondary | ICD-10-CM | POA: Diagnosis not present

## 2023-02-01 DIAGNOSIS — Z6831 Body mass index (BMI) 31.0-31.9, adult: Secondary | ICD-10-CM | POA: Diagnosis not present

## 2023-02-01 DIAGNOSIS — Z299 Encounter for prophylactic measures, unspecified: Secondary | ICD-10-CM | POA: Diagnosis not present

## 2023-02-01 DIAGNOSIS — E1165 Type 2 diabetes mellitus with hyperglycemia: Secondary | ICD-10-CM | POA: Diagnosis not present

## 2023-02-01 DIAGNOSIS — I10 Essential (primary) hypertension: Secondary | ICD-10-CM | POA: Diagnosis not present

## 2023-03-01 DIAGNOSIS — L853 Xerosis cutis: Secondary | ICD-10-CM | POA: Diagnosis not present

## 2023-03-01 DIAGNOSIS — L728 Other follicular cysts of the skin and subcutaneous tissue: Secondary | ICD-10-CM | POA: Diagnosis not present

## 2023-03-01 DIAGNOSIS — L821 Other seborrheic keratosis: Secondary | ICD-10-CM | POA: Diagnosis not present

## 2023-03-01 DIAGNOSIS — D485 Neoplasm of uncertain behavior of skin: Secondary | ICD-10-CM | POA: Diagnosis not present

## 2023-03-01 DIAGNOSIS — L905 Scar conditions and fibrosis of skin: Secondary | ICD-10-CM | POA: Diagnosis not present

## 2023-03-01 DIAGNOSIS — L209 Atopic dermatitis, unspecified: Secondary | ICD-10-CM | POA: Diagnosis not present

## 2023-03-01 DIAGNOSIS — L570 Actinic keratosis: Secondary | ICD-10-CM | POA: Diagnosis not present

## 2023-03-01 DIAGNOSIS — Z85828 Personal history of other malignant neoplasm of skin: Secondary | ICD-10-CM | POA: Diagnosis not present

## 2023-03-04 DIAGNOSIS — M5416 Radiculopathy, lumbar region: Secondary | ICD-10-CM | POA: Diagnosis not present

## 2023-03-04 DIAGNOSIS — M519 Unspecified thoracic, thoracolumbar and lumbosacral intervertebral disc disorder: Secondary | ICD-10-CM | POA: Diagnosis not present

## 2023-03-04 DIAGNOSIS — M48062 Spinal stenosis, lumbar region with neurogenic claudication: Secondary | ICD-10-CM | POA: Diagnosis not present

## 2023-04-05 DIAGNOSIS — E1151 Type 2 diabetes mellitus with diabetic peripheral angiopathy without gangrene: Secondary | ICD-10-CM | POA: Diagnosis not present

## 2023-04-05 DIAGNOSIS — E1140 Type 2 diabetes mellitus with diabetic neuropathy, unspecified: Secondary | ICD-10-CM | POA: Diagnosis not present

## 2023-05-04 DIAGNOSIS — C61 Malignant neoplasm of prostate: Secondary | ICD-10-CM | POA: Diagnosis not present

## 2023-05-04 DIAGNOSIS — N401 Enlarged prostate with lower urinary tract symptoms: Secondary | ICD-10-CM | POA: Diagnosis not present

## 2023-05-12 DIAGNOSIS — C61 Malignant neoplasm of prostate: Secondary | ICD-10-CM | POA: Diagnosis not present

## 2023-05-12 DIAGNOSIS — N401 Enlarged prostate with lower urinary tract symptoms: Secondary | ICD-10-CM | POA: Diagnosis not present

## 2023-05-12 DIAGNOSIS — F5221 Male erectile disorder: Secondary | ICD-10-CM | POA: Diagnosis not present

## 2023-05-14 DIAGNOSIS — C61 Malignant neoplasm of prostate: Secondary | ICD-10-CM | POA: Diagnosis not present

## 2023-05-14 DIAGNOSIS — R0981 Nasal congestion: Secondary | ICD-10-CM | POA: Diagnosis not present

## 2023-05-14 DIAGNOSIS — J069 Acute upper respiratory infection, unspecified: Secondary | ICD-10-CM | POA: Diagnosis not present

## 2023-05-17 DIAGNOSIS — C61 Malignant neoplasm of prostate: Secondary | ICD-10-CM | POA: Diagnosis not present

## 2023-05-17 DIAGNOSIS — M549 Dorsalgia, unspecified: Secondary | ICD-10-CM | POA: Diagnosis not present

## 2023-05-17 DIAGNOSIS — I10 Essential (primary) hypertension: Secondary | ICD-10-CM | POA: Diagnosis not present

## 2023-05-17 DIAGNOSIS — Z1331 Encounter for screening for depression: Secondary | ICD-10-CM | POA: Diagnosis not present

## 2023-05-17 DIAGNOSIS — E7800 Pure hypercholesterolemia, unspecified: Secondary | ICD-10-CM | POA: Diagnosis not present

## 2023-05-17 DIAGNOSIS — Z299 Encounter for prophylactic measures, unspecified: Secondary | ICD-10-CM | POA: Diagnosis not present

## 2023-05-17 DIAGNOSIS — Z79899 Other long term (current) drug therapy: Secondary | ICD-10-CM | POA: Diagnosis not present

## 2023-05-17 DIAGNOSIS — R52 Pain, unspecified: Secondary | ICD-10-CM | POA: Diagnosis not present

## 2023-05-17 DIAGNOSIS — Z0000 Encounter for general adult medical examination without abnormal findings: Secondary | ICD-10-CM | POA: Diagnosis not present

## 2023-05-17 DIAGNOSIS — Z7189 Other specified counseling: Secondary | ICD-10-CM | POA: Diagnosis not present

## 2023-05-17 DIAGNOSIS — R5383 Other fatigue: Secondary | ICD-10-CM | POA: Diagnosis not present

## 2023-05-17 DIAGNOSIS — Z1339 Encounter for screening examination for other mental health and behavioral disorders: Secondary | ICD-10-CM | POA: Diagnosis not present

## 2023-05-21 DIAGNOSIS — M47816 Spondylosis without myelopathy or radiculopathy, lumbar region: Secondary | ICD-10-CM | POA: Diagnosis not present

## 2023-05-21 DIAGNOSIS — M48062 Spinal stenosis, lumbar region with neurogenic claudication: Secondary | ICD-10-CM | POA: Diagnosis not present

## 2023-05-31 DIAGNOSIS — Z299 Encounter for prophylactic measures, unspecified: Secondary | ICD-10-CM | POA: Diagnosis not present

## 2023-05-31 DIAGNOSIS — J069 Acute upper respiratory infection, unspecified: Secondary | ICD-10-CM | POA: Diagnosis not present

## 2023-05-31 DIAGNOSIS — M549 Dorsalgia, unspecified: Secondary | ICD-10-CM | POA: Diagnosis not present

## 2023-05-31 DIAGNOSIS — R5383 Other fatigue: Secondary | ICD-10-CM | POA: Diagnosis not present

## 2023-06-02 DIAGNOSIS — Z7984 Long term (current) use of oral hypoglycemic drugs: Secondary | ICD-10-CM | POA: Diagnosis not present

## 2023-06-02 DIAGNOSIS — E119 Type 2 diabetes mellitus without complications: Secondary | ICD-10-CM | POA: Diagnosis not present

## 2023-06-02 DIAGNOSIS — G4730 Sleep apnea, unspecified: Secondary | ICD-10-CM | POA: Diagnosis not present

## 2023-06-02 DIAGNOSIS — I252 Old myocardial infarction: Secondary | ICD-10-CM | POA: Diagnosis not present

## 2023-06-02 DIAGNOSIS — Z01818 Encounter for other preprocedural examination: Secondary | ICD-10-CM | POA: Diagnosis not present

## 2023-06-02 DIAGNOSIS — Z794 Long term (current) use of insulin: Secondary | ICD-10-CM | POA: Diagnosis not present

## 2023-06-02 DIAGNOSIS — G8929 Other chronic pain: Secondary | ICD-10-CM | POA: Diagnosis not present

## 2023-06-02 DIAGNOSIS — I10 Essential (primary) hypertension: Secondary | ICD-10-CM | POA: Diagnosis not present

## 2023-06-02 DIAGNOSIS — I25119 Atherosclerotic heart disease of native coronary artery with unspecified angina pectoris: Secondary | ICD-10-CM | POA: Diagnosis not present

## 2023-06-02 DIAGNOSIS — I25118 Atherosclerotic heart disease of native coronary artery with other forms of angina pectoris: Secondary | ICD-10-CM | POA: Diagnosis not present

## 2023-06-02 DIAGNOSIS — Z7982 Long term (current) use of aspirin: Secondary | ICD-10-CM | POA: Diagnosis not present

## 2023-06-02 DIAGNOSIS — Z01812 Encounter for preprocedural laboratory examination: Secondary | ICD-10-CM | POA: Diagnosis not present

## 2023-06-02 DIAGNOSIS — Z7985 Long-term (current) use of injectable non-insulin antidiabetic drugs: Secondary | ICD-10-CM | POA: Diagnosis not present

## 2023-06-02 DIAGNOSIS — Z8673 Personal history of transient ischemic attack (TIA), and cerebral infarction without residual deficits: Secondary | ICD-10-CM | POA: Diagnosis not present

## 2023-06-02 DIAGNOSIS — M48062 Spinal stenosis, lumbar region with neurogenic claudication: Secondary | ICD-10-CM | POA: Diagnosis not present

## 2023-06-02 DIAGNOSIS — G4733 Obstructive sleep apnea (adult) (pediatric): Secondary | ICD-10-CM | POA: Diagnosis not present

## 2023-06-02 DIAGNOSIS — Z87891 Personal history of nicotine dependence: Secondary | ICD-10-CM | POA: Diagnosis not present

## 2023-06-02 DIAGNOSIS — Z79899 Other long term (current) drug therapy: Secondary | ICD-10-CM | POA: Diagnosis not present

## 2023-06-02 DIAGNOSIS — M5116 Intervertebral disc disorders with radiculopathy, lumbar region: Secondary | ICD-10-CM | POA: Diagnosis not present

## 2023-06-09 DIAGNOSIS — M48062 Spinal stenosis, lumbar region with neurogenic claudication: Secondary | ICD-10-CM | POA: Diagnosis not present

## 2023-06-09 DIAGNOSIS — Z01812 Encounter for preprocedural laboratory examination: Secondary | ICD-10-CM | POA: Diagnosis not present

## 2023-06-09 DIAGNOSIS — G8929 Other chronic pain: Secondary | ICD-10-CM | POA: Diagnosis not present

## 2023-06-09 DIAGNOSIS — I25118 Atherosclerotic heart disease of native coronary artery with other forms of angina pectoris: Secondary | ICD-10-CM | POA: Diagnosis not present

## 2023-06-09 DIAGNOSIS — M5116 Intervertebral disc disorders with radiculopathy, lumbar region: Secondary | ICD-10-CM | POA: Diagnosis not present

## 2023-06-09 DIAGNOSIS — M48061 Spinal stenosis, lumbar region without neurogenic claudication: Secondary | ICD-10-CM | POA: Diagnosis not present

## 2023-06-09 DIAGNOSIS — E119 Type 2 diabetes mellitus without complications: Secondary | ICD-10-CM | POA: Diagnosis not present

## 2023-06-10 DIAGNOSIS — Z794 Long term (current) use of insulin: Secondary | ICD-10-CM | POA: Diagnosis not present

## 2023-06-10 DIAGNOSIS — Z87442 Personal history of urinary calculi: Secondary | ICD-10-CM | POA: Diagnosis not present

## 2023-06-10 DIAGNOSIS — G4733 Obstructive sleep apnea (adult) (pediatric): Secondary | ICD-10-CM | POA: Diagnosis not present

## 2023-06-10 DIAGNOSIS — Z7984 Long term (current) use of oral hypoglycemic drugs: Secondary | ICD-10-CM | POA: Diagnosis not present

## 2023-06-10 DIAGNOSIS — Z87891 Personal history of nicotine dependence: Secondary | ICD-10-CM | POA: Diagnosis not present

## 2023-06-10 DIAGNOSIS — I2510 Atherosclerotic heart disease of native coronary artery without angina pectoris: Secondary | ICD-10-CM | POA: Diagnosis not present

## 2023-06-10 DIAGNOSIS — M48062 Spinal stenosis, lumbar region with neurogenic claudication: Secondary | ICD-10-CM | POA: Diagnosis not present

## 2023-06-10 DIAGNOSIS — E119 Type 2 diabetes mellitus without complications: Secondary | ICD-10-CM | POA: Diagnosis not present

## 2023-06-10 DIAGNOSIS — F411 Generalized anxiety disorder: Secondary | ICD-10-CM | POA: Diagnosis not present

## 2023-06-10 DIAGNOSIS — K449 Diaphragmatic hernia without obstruction or gangrene: Secondary | ICD-10-CM | POA: Diagnosis not present

## 2023-06-10 DIAGNOSIS — I219 Acute myocardial infarction, unspecified: Secondary | ICD-10-CM | POA: Diagnosis not present

## 2023-06-10 DIAGNOSIS — Z9181 History of falling: Secondary | ICD-10-CM | POA: Diagnosis not present

## 2023-06-10 DIAGNOSIS — K219 Gastro-esophageal reflux disease without esophagitis: Secondary | ICD-10-CM | POA: Diagnosis not present

## 2023-06-10 DIAGNOSIS — F32A Depression, unspecified: Secondary | ICD-10-CM | POA: Diagnosis not present

## 2023-06-10 DIAGNOSIS — Z955 Presence of coronary angioplasty implant and graft: Secondary | ICD-10-CM | POA: Diagnosis not present

## 2023-06-10 DIAGNOSIS — Z9049 Acquired absence of other specified parts of digestive tract: Secondary | ICD-10-CM | POA: Diagnosis not present

## 2023-06-10 DIAGNOSIS — Z8673 Personal history of transient ischemic attack (TIA), and cerebral infarction without residual deficits: Secondary | ICD-10-CM | POA: Diagnosis not present

## 2023-06-10 DIAGNOSIS — Z8546 Personal history of malignant neoplasm of prostate: Secondary | ICD-10-CM | POA: Diagnosis not present

## 2023-06-10 DIAGNOSIS — Z4789 Encounter for other orthopedic aftercare: Secondary | ICD-10-CM | POA: Diagnosis not present

## 2023-06-14 DIAGNOSIS — I219 Acute myocardial infarction, unspecified: Secondary | ICD-10-CM | POA: Diagnosis not present

## 2023-06-14 DIAGNOSIS — F411 Generalized anxiety disorder: Secondary | ICD-10-CM | POA: Diagnosis not present

## 2023-06-14 DIAGNOSIS — E119 Type 2 diabetes mellitus without complications: Secondary | ICD-10-CM | POA: Diagnosis not present

## 2023-06-14 DIAGNOSIS — Z4789 Encounter for other orthopedic aftercare: Secondary | ICD-10-CM | POA: Diagnosis not present

## 2023-06-14 DIAGNOSIS — M48062 Spinal stenosis, lumbar region with neurogenic claudication: Secondary | ICD-10-CM | POA: Diagnosis not present

## 2023-06-14 DIAGNOSIS — F32A Depression, unspecified: Secondary | ICD-10-CM | POA: Diagnosis not present

## 2023-06-15 DIAGNOSIS — F32A Depression, unspecified: Secondary | ICD-10-CM | POA: Diagnosis not present

## 2023-06-15 DIAGNOSIS — E119 Type 2 diabetes mellitus without complications: Secondary | ICD-10-CM | POA: Diagnosis not present

## 2023-06-15 DIAGNOSIS — Z4789 Encounter for other orthopedic aftercare: Secondary | ICD-10-CM | POA: Diagnosis not present

## 2023-06-15 DIAGNOSIS — F411 Generalized anxiety disorder: Secondary | ICD-10-CM | POA: Diagnosis not present

## 2023-06-15 DIAGNOSIS — I219 Acute myocardial infarction, unspecified: Secondary | ICD-10-CM | POA: Diagnosis not present

## 2023-06-15 DIAGNOSIS — M48062 Spinal stenosis, lumbar region with neurogenic claudication: Secondary | ICD-10-CM | POA: Diagnosis not present

## 2023-06-17 DIAGNOSIS — I219 Acute myocardial infarction, unspecified: Secondary | ICD-10-CM | POA: Diagnosis not present

## 2023-06-17 DIAGNOSIS — Z4789 Encounter for other orthopedic aftercare: Secondary | ICD-10-CM | POA: Diagnosis not present

## 2023-06-17 DIAGNOSIS — F32A Depression, unspecified: Secondary | ICD-10-CM | POA: Diagnosis not present

## 2023-06-17 DIAGNOSIS — M48062 Spinal stenosis, lumbar region with neurogenic claudication: Secondary | ICD-10-CM | POA: Diagnosis not present

## 2023-06-17 DIAGNOSIS — F411 Generalized anxiety disorder: Secondary | ICD-10-CM | POA: Diagnosis not present

## 2023-06-17 DIAGNOSIS — E119 Type 2 diabetes mellitus without complications: Secondary | ICD-10-CM | POA: Diagnosis not present

## 2023-06-18 DIAGNOSIS — I219 Acute myocardial infarction, unspecified: Secondary | ICD-10-CM | POA: Diagnosis not present

## 2023-06-18 DIAGNOSIS — Z4789 Encounter for other orthopedic aftercare: Secondary | ICD-10-CM | POA: Diagnosis not present

## 2023-06-18 DIAGNOSIS — M48062 Spinal stenosis, lumbar region with neurogenic claudication: Secondary | ICD-10-CM | POA: Diagnosis not present

## 2023-06-18 DIAGNOSIS — E119 Type 2 diabetes mellitus without complications: Secondary | ICD-10-CM | POA: Diagnosis not present

## 2023-06-18 DIAGNOSIS — F411 Generalized anxiety disorder: Secondary | ICD-10-CM | POA: Diagnosis not present

## 2023-06-18 DIAGNOSIS — F32A Depression, unspecified: Secondary | ICD-10-CM | POA: Diagnosis not present

## 2023-06-21 DIAGNOSIS — I219 Acute myocardial infarction, unspecified: Secondary | ICD-10-CM | POA: Diagnosis not present

## 2023-06-21 DIAGNOSIS — F32A Depression, unspecified: Secondary | ICD-10-CM | POA: Diagnosis not present

## 2023-06-21 DIAGNOSIS — Z4789 Encounter for other orthopedic aftercare: Secondary | ICD-10-CM | POA: Diagnosis not present

## 2023-06-21 DIAGNOSIS — E119 Type 2 diabetes mellitus without complications: Secondary | ICD-10-CM | POA: Diagnosis not present

## 2023-06-21 DIAGNOSIS — M48062 Spinal stenosis, lumbar region with neurogenic claudication: Secondary | ICD-10-CM | POA: Diagnosis not present

## 2023-06-21 DIAGNOSIS — F411 Generalized anxiety disorder: Secondary | ICD-10-CM | POA: Diagnosis not present

## 2023-06-23 DIAGNOSIS — F32A Depression, unspecified: Secondary | ICD-10-CM | POA: Diagnosis not present

## 2023-06-23 DIAGNOSIS — F411 Generalized anxiety disorder: Secondary | ICD-10-CM | POA: Diagnosis not present

## 2023-06-23 DIAGNOSIS — I219 Acute myocardial infarction, unspecified: Secondary | ICD-10-CM | POA: Diagnosis not present

## 2023-06-23 DIAGNOSIS — Z4789 Encounter for other orthopedic aftercare: Secondary | ICD-10-CM | POA: Diagnosis not present

## 2023-06-23 DIAGNOSIS — M48062 Spinal stenosis, lumbar region with neurogenic claudication: Secondary | ICD-10-CM | POA: Diagnosis not present

## 2023-06-23 DIAGNOSIS — E119 Type 2 diabetes mellitus without complications: Secondary | ICD-10-CM | POA: Diagnosis not present

## 2023-06-25 DIAGNOSIS — E119 Type 2 diabetes mellitus without complications: Secondary | ICD-10-CM | POA: Diagnosis not present

## 2023-06-25 DIAGNOSIS — Z4789 Encounter for other orthopedic aftercare: Secondary | ICD-10-CM | POA: Diagnosis not present

## 2023-06-25 DIAGNOSIS — M48062 Spinal stenosis, lumbar region with neurogenic claudication: Secondary | ICD-10-CM | POA: Diagnosis not present

## 2023-06-25 DIAGNOSIS — F32A Depression, unspecified: Secondary | ICD-10-CM | POA: Diagnosis not present

## 2023-06-25 DIAGNOSIS — F411 Generalized anxiety disorder: Secondary | ICD-10-CM | POA: Diagnosis not present

## 2023-06-25 DIAGNOSIS — I219 Acute myocardial infarction, unspecified: Secondary | ICD-10-CM | POA: Diagnosis not present

## 2023-06-28 DIAGNOSIS — M48062 Spinal stenosis, lumbar region with neurogenic claudication: Secondary | ICD-10-CM | POA: Diagnosis not present

## 2023-06-28 DIAGNOSIS — Z4789 Encounter for other orthopedic aftercare: Secondary | ICD-10-CM | POA: Diagnosis not present

## 2023-06-28 DIAGNOSIS — E119 Type 2 diabetes mellitus without complications: Secondary | ICD-10-CM | POA: Diagnosis not present

## 2023-06-28 DIAGNOSIS — F411 Generalized anxiety disorder: Secondary | ICD-10-CM | POA: Diagnosis not present

## 2023-06-28 DIAGNOSIS — I219 Acute myocardial infarction, unspecified: Secondary | ICD-10-CM | POA: Diagnosis not present

## 2023-06-28 DIAGNOSIS — F32A Depression, unspecified: Secondary | ICD-10-CM | POA: Diagnosis not present

## 2023-07-01 DIAGNOSIS — F32A Depression, unspecified: Secondary | ICD-10-CM | POA: Diagnosis not present

## 2023-07-01 DIAGNOSIS — F411 Generalized anxiety disorder: Secondary | ICD-10-CM | POA: Diagnosis not present

## 2023-07-01 DIAGNOSIS — E119 Type 2 diabetes mellitus without complications: Secondary | ICD-10-CM | POA: Diagnosis not present

## 2023-07-01 DIAGNOSIS — M48062 Spinal stenosis, lumbar region with neurogenic claudication: Secondary | ICD-10-CM | POA: Diagnosis not present

## 2023-07-01 DIAGNOSIS — I219 Acute myocardial infarction, unspecified: Secondary | ICD-10-CM | POA: Diagnosis not present

## 2023-07-01 DIAGNOSIS — Z4789 Encounter for other orthopedic aftercare: Secondary | ICD-10-CM | POA: Diagnosis not present

## 2023-07-02 DIAGNOSIS — I219 Acute myocardial infarction, unspecified: Secondary | ICD-10-CM | POA: Diagnosis not present

## 2023-07-02 DIAGNOSIS — E119 Type 2 diabetes mellitus without complications: Secondary | ICD-10-CM | POA: Diagnosis not present

## 2023-07-02 DIAGNOSIS — Z4789 Encounter for other orthopedic aftercare: Secondary | ICD-10-CM | POA: Diagnosis not present

## 2023-07-02 DIAGNOSIS — F411 Generalized anxiety disorder: Secondary | ICD-10-CM | POA: Diagnosis not present

## 2023-07-02 DIAGNOSIS — F32A Depression, unspecified: Secondary | ICD-10-CM | POA: Diagnosis not present

## 2023-07-02 DIAGNOSIS — M48062 Spinal stenosis, lumbar region with neurogenic claudication: Secondary | ICD-10-CM | POA: Diagnosis not present

## 2023-07-05 DIAGNOSIS — I219 Acute myocardial infarction, unspecified: Secondary | ICD-10-CM | POA: Diagnosis not present

## 2023-07-05 DIAGNOSIS — F32A Depression, unspecified: Secondary | ICD-10-CM | POA: Diagnosis not present

## 2023-07-05 DIAGNOSIS — F411 Generalized anxiety disorder: Secondary | ICD-10-CM | POA: Diagnosis not present

## 2023-07-05 DIAGNOSIS — E119 Type 2 diabetes mellitus without complications: Secondary | ICD-10-CM | POA: Diagnosis not present

## 2023-07-05 DIAGNOSIS — Z4789 Encounter for other orthopedic aftercare: Secondary | ICD-10-CM | POA: Diagnosis not present

## 2023-07-05 DIAGNOSIS — M48062 Spinal stenosis, lumbar region with neurogenic claudication: Secondary | ICD-10-CM | POA: Diagnosis not present

## 2023-07-08 DIAGNOSIS — F32A Depression, unspecified: Secondary | ICD-10-CM | POA: Diagnosis not present

## 2023-07-08 DIAGNOSIS — E119 Type 2 diabetes mellitus without complications: Secondary | ICD-10-CM | POA: Diagnosis not present

## 2023-07-08 DIAGNOSIS — F411 Generalized anxiety disorder: Secondary | ICD-10-CM | POA: Diagnosis not present

## 2023-07-08 DIAGNOSIS — I219 Acute myocardial infarction, unspecified: Secondary | ICD-10-CM | POA: Diagnosis not present

## 2023-07-08 DIAGNOSIS — Z4789 Encounter for other orthopedic aftercare: Secondary | ICD-10-CM | POA: Diagnosis not present

## 2023-07-08 DIAGNOSIS — M48062 Spinal stenosis, lumbar region with neurogenic claudication: Secondary | ICD-10-CM | POA: Diagnosis not present

## 2023-07-09 DIAGNOSIS — Z4789 Encounter for other orthopedic aftercare: Secondary | ICD-10-CM | POA: Diagnosis not present

## 2023-07-09 DIAGNOSIS — F411 Generalized anxiety disorder: Secondary | ICD-10-CM | POA: Diagnosis not present

## 2023-07-09 DIAGNOSIS — E119 Type 2 diabetes mellitus without complications: Secondary | ICD-10-CM | POA: Diagnosis not present

## 2023-07-09 DIAGNOSIS — F32A Depression, unspecified: Secondary | ICD-10-CM | POA: Diagnosis not present

## 2023-07-09 DIAGNOSIS — M48062 Spinal stenosis, lumbar region with neurogenic claudication: Secondary | ICD-10-CM | POA: Diagnosis not present

## 2023-07-09 DIAGNOSIS — I219 Acute myocardial infarction, unspecified: Secondary | ICD-10-CM | POA: Diagnosis not present

## 2023-07-10 DIAGNOSIS — F32A Depression, unspecified: Secondary | ICD-10-CM | POA: Diagnosis not present

## 2023-07-10 DIAGNOSIS — Z7984 Long term (current) use of oral hypoglycemic drugs: Secondary | ICD-10-CM | POA: Diagnosis not present

## 2023-07-10 DIAGNOSIS — G4733 Obstructive sleep apnea (adult) (pediatric): Secondary | ICD-10-CM | POA: Diagnosis not present

## 2023-07-10 DIAGNOSIS — Z8546 Personal history of malignant neoplasm of prostate: Secondary | ICD-10-CM | POA: Diagnosis not present

## 2023-07-10 DIAGNOSIS — Z4789 Encounter for other orthopedic aftercare: Secondary | ICD-10-CM | POA: Diagnosis not present

## 2023-07-10 DIAGNOSIS — F411 Generalized anxiety disorder: Secondary | ICD-10-CM | POA: Diagnosis not present

## 2023-07-10 DIAGNOSIS — K219 Gastro-esophageal reflux disease without esophagitis: Secondary | ICD-10-CM | POA: Diagnosis not present

## 2023-07-10 DIAGNOSIS — Z8673 Personal history of transient ischemic attack (TIA), and cerebral infarction without residual deficits: Secondary | ICD-10-CM | POA: Diagnosis not present

## 2023-07-10 DIAGNOSIS — Z955 Presence of coronary angioplasty implant and graft: Secondary | ICD-10-CM | POA: Diagnosis not present

## 2023-07-10 DIAGNOSIS — M48062 Spinal stenosis, lumbar region with neurogenic claudication: Secondary | ICD-10-CM | POA: Diagnosis not present

## 2023-07-10 DIAGNOSIS — Z9049 Acquired absence of other specified parts of digestive tract: Secondary | ICD-10-CM | POA: Diagnosis not present

## 2023-07-10 DIAGNOSIS — I219 Acute myocardial infarction, unspecified: Secondary | ICD-10-CM | POA: Diagnosis not present

## 2023-07-10 DIAGNOSIS — Z794 Long term (current) use of insulin: Secondary | ICD-10-CM | POA: Diagnosis not present

## 2023-07-10 DIAGNOSIS — I2510 Atherosclerotic heart disease of native coronary artery without angina pectoris: Secondary | ICD-10-CM | POA: Diagnosis not present

## 2023-07-10 DIAGNOSIS — Z87891 Personal history of nicotine dependence: Secondary | ICD-10-CM | POA: Diagnosis not present

## 2023-07-10 DIAGNOSIS — E119 Type 2 diabetes mellitus without complications: Secondary | ICD-10-CM | POA: Diagnosis not present

## 2023-07-10 DIAGNOSIS — Z9181 History of falling: Secondary | ICD-10-CM | POA: Diagnosis not present

## 2023-07-10 DIAGNOSIS — Z87442 Personal history of urinary calculi: Secondary | ICD-10-CM | POA: Diagnosis not present

## 2023-07-10 DIAGNOSIS — K449 Diaphragmatic hernia without obstruction or gangrene: Secondary | ICD-10-CM | POA: Diagnosis not present

## 2023-07-12 DIAGNOSIS — I10 Essential (primary) hypertension: Secondary | ICD-10-CM | POA: Diagnosis not present

## 2023-07-12 DIAGNOSIS — R002 Palpitations: Secondary | ICD-10-CM | POA: Diagnosis not present

## 2023-07-12 DIAGNOSIS — I4510 Unspecified right bundle-branch block: Secondary | ICD-10-CM | POA: Diagnosis not present

## 2023-07-12 DIAGNOSIS — R0789 Other chest pain: Secondary | ICD-10-CM | POA: Diagnosis not present

## 2023-07-12 DIAGNOSIS — Z6830 Body mass index (BMI) 30.0-30.9, adult: Secondary | ICD-10-CM | POA: Diagnosis not present

## 2023-07-12 DIAGNOSIS — E119 Type 2 diabetes mellitus without complications: Secondary | ICD-10-CM | POA: Diagnosis not present

## 2023-07-12 DIAGNOSIS — R55 Syncope and collapse: Secondary | ICD-10-CM | POA: Diagnosis not present

## 2023-07-12 DIAGNOSIS — I2510 Atherosclerotic heart disease of native coronary artery without angina pectoris: Secondary | ICD-10-CM | POA: Diagnosis not present

## 2023-07-12 DIAGNOSIS — R0609 Other forms of dyspnea: Secondary | ICD-10-CM | POA: Diagnosis not present

## 2023-07-12 DIAGNOSIS — G4730 Sleep apnea, unspecified: Secondary | ICD-10-CM | POA: Diagnosis not present

## 2023-07-13 DIAGNOSIS — E1151 Type 2 diabetes mellitus with diabetic peripheral angiopathy without gangrene: Secondary | ICD-10-CM | POA: Diagnosis not present

## 2023-07-13 DIAGNOSIS — E1140 Type 2 diabetes mellitus with diabetic neuropathy, unspecified: Secondary | ICD-10-CM | POA: Diagnosis not present

## 2023-07-14 DIAGNOSIS — F411 Generalized anxiety disorder: Secondary | ICD-10-CM | POA: Diagnosis not present

## 2023-07-14 DIAGNOSIS — I219 Acute myocardial infarction, unspecified: Secondary | ICD-10-CM | POA: Diagnosis not present

## 2023-07-14 DIAGNOSIS — M48062 Spinal stenosis, lumbar region with neurogenic claudication: Secondary | ICD-10-CM | POA: Diagnosis not present

## 2023-07-14 DIAGNOSIS — F32A Depression, unspecified: Secondary | ICD-10-CM | POA: Diagnosis not present

## 2023-07-14 DIAGNOSIS — Z4789 Encounter for other orthopedic aftercare: Secondary | ICD-10-CM | POA: Diagnosis not present

## 2023-07-14 DIAGNOSIS — E119 Type 2 diabetes mellitus without complications: Secondary | ICD-10-CM | POA: Diagnosis not present

## 2023-07-21 DIAGNOSIS — F411 Generalized anxiety disorder: Secondary | ICD-10-CM | POA: Diagnosis not present

## 2023-07-21 DIAGNOSIS — I219 Acute myocardial infarction, unspecified: Secondary | ICD-10-CM | POA: Diagnosis not present

## 2023-07-21 DIAGNOSIS — F32A Depression, unspecified: Secondary | ICD-10-CM | POA: Diagnosis not present

## 2023-07-21 DIAGNOSIS — Z4789 Encounter for other orthopedic aftercare: Secondary | ICD-10-CM | POA: Diagnosis not present

## 2023-07-21 DIAGNOSIS — M48062 Spinal stenosis, lumbar region with neurogenic claudication: Secondary | ICD-10-CM | POA: Diagnosis not present

## 2023-07-21 DIAGNOSIS — E119 Type 2 diabetes mellitus without complications: Secondary | ICD-10-CM | POA: Diagnosis not present

## 2023-07-29 DIAGNOSIS — M48062 Spinal stenosis, lumbar region with neurogenic claudication: Secondary | ICD-10-CM | POA: Diagnosis not present

## 2023-07-29 DIAGNOSIS — Z4789 Encounter for other orthopedic aftercare: Secondary | ICD-10-CM | POA: Diagnosis not present

## 2023-07-29 DIAGNOSIS — F32A Depression, unspecified: Secondary | ICD-10-CM | POA: Diagnosis not present

## 2023-07-29 DIAGNOSIS — F411 Generalized anxiety disorder: Secondary | ICD-10-CM | POA: Diagnosis not present

## 2023-07-29 DIAGNOSIS — I219 Acute myocardial infarction, unspecified: Secondary | ICD-10-CM | POA: Diagnosis not present

## 2023-07-29 DIAGNOSIS — E119 Type 2 diabetes mellitus without complications: Secondary | ICD-10-CM | POA: Diagnosis not present

## 2023-08-02 DIAGNOSIS — H3540 Unspecified peripheral retinal degeneration: Secondary | ICD-10-CM | POA: Diagnosis not present

## 2023-08-02 DIAGNOSIS — H04123 Dry eye syndrome of bilateral lacrimal glands: Secondary | ICD-10-CM | POA: Diagnosis not present

## 2023-08-02 DIAGNOSIS — H40023 Open angle with borderline findings, high risk, bilateral: Secondary | ICD-10-CM | POA: Diagnosis not present

## 2023-08-02 DIAGNOSIS — H35373 Puckering of macula, bilateral: Secondary | ICD-10-CM | POA: Diagnosis not present

## 2023-08-02 DIAGNOSIS — E119 Type 2 diabetes mellitus without complications: Secondary | ICD-10-CM | POA: Diagnosis not present

## 2023-08-02 DIAGNOSIS — H524 Presbyopia: Secondary | ICD-10-CM | POA: Diagnosis not present

## 2023-08-02 DIAGNOSIS — H1045 Other chronic allergic conjunctivitis: Secondary | ICD-10-CM | POA: Diagnosis not present

## 2023-08-02 DIAGNOSIS — D3132 Benign neoplasm of left choroid: Secondary | ICD-10-CM | POA: Diagnosis not present

## 2023-08-02 DIAGNOSIS — Z961 Presence of intraocular lens: Secondary | ICD-10-CM | POA: Diagnosis not present

## 2023-08-04 DIAGNOSIS — F32A Depression, unspecified: Secondary | ICD-10-CM | POA: Diagnosis not present

## 2023-08-04 DIAGNOSIS — Z4789 Encounter for other orthopedic aftercare: Secondary | ICD-10-CM | POA: Diagnosis not present

## 2023-08-04 DIAGNOSIS — M48062 Spinal stenosis, lumbar region with neurogenic claudication: Secondary | ICD-10-CM | POA: Diagnosis not present

## 2023-08-04 DIAGNOSIS — E119 Type 2 diabetes mellitus without complications: Secondary | ICD-10-CM | POA: Diagnosis not present

## 2023-08-04 DIAGNOSIS — I219 Acute myocardial infarction, unspecified: Secondary | ICD-10-CM | POA: Diagnosis not present

## 2023-08-04 DIAGNOSIS — F411 Generalized anxiety disorder: Secondary | ICD-10-CM | POA: Diagnosis not present

## 2023-08-05 DIAGNOSIS — R0981 Nasal congestion: Secondary | ICD-10-CM | POA: Diagnosis not present

## 2023-08-05 DIAGNOSIS — E7800 Pure hypercholesterolemia, unspecified: Secondary | ICD-10-CM | POA: Diagnosis not present

## 2023-08-05 DIAGNOSIS — R1110 Vomiting, unspecified: Secondary | ICD-10-CM | POA: Diagnosis not present

## 2023-08-06 DIAGNOSIS — M48062 Spinal stenosis, lumbar region with neurogenic claudication: Secondary | ICD-10-CM | POA: Diagnosis not present

## 2023-08-06 DIAGNOSIS — F32A Depression, unspecified: Secondary | ICD-10-CM | POA: Diagnosis not present

## 2023-08-06 DIAGNOSIS — F411 Generalized anxiety disorder: Secondary | ICD-10-CM | POA: Diagnosis not present

## 2023-08-06 DIAGNOSIS — Z4789 Encounter for other orthopedic aftercare: Secondary | ICD-10-CM | POA: Diagnosis not present

## 2023-08-06 DIAGNOSIS — E119 Type 2 diabetes mellitus without complications: Secondary | ICD-10-CM | POA: Diagnosis not present

## 2023-08-06 DIAGNOSIS — I219 Acute myocardial infarction, unspecified: Secondary | ICD-10-CM | POA: Diagnosis not present

## 2023-08-18 DIAGNOSIS — J069 Acute upper respiratory infection, unspecified: Secondary | ICD-10-CM | POA: Diagnosis not present

## 2023-08-18 DIAGNOSIS — J3489 Other specified disorders of nose and nasal sinuses: Secondary | ICD-10-CM | POA: Diagnosis not present

## 2023-08-18 DIAGNOSIS — R058 Other specified cough: Secondary | ICD-10-CM | POA: Diagnosis not present

## 2023-08-19 DIAGNOSIS — M6281 Muscle weakness (generalized): Secondary | ICD-10-CM | POA: Diagnosis not present

## 2023-08-19 DIAGNOSIS — M519 Unspecified thoracic, thoracolumbar and lumbosacral intervertebral disc disorder: Secondary | ICD-10-CM | POA: Diagnosis not present

## 2023-08-19 DIAGNOSIS — R2681 Unsteadiness on feet: Secondary | ICD-10-CM | POA: Diagnosis not present

## 2023-08-19 DIAGNOSIS — M545 Low back pain, unspecified: Secondary | ICD-10-CM | POA: Diagnosis not present

## 2023-08-20 DIAGNOSIS — R5383 Other fatigue: Secondary | ICD-10-CM | POA: Diagnosis not present

## 2023-08-20 DIAGNOSIS — U071 COVID-19: Secondary | ICD-10-CM | POA: Diagnosis not present

## 2023-08-20 DIAGNOSIS — Z299 Encounter for prophylactic measures, unspecified: Secondary | ICD-10-CM | POA: Diagnosis not present

## 2023-08-20 DIAGNOSIS — R059 Cough, unspecified: Secondary | ICD-10-CM | POA: Diagnosis not present

## 2023-08-20 DIAGNOSIS — R0981 Nasal congestion: Secondary | ICD-10-CM | POA: Diagnosis not present

## 2023-08-25 DIAGNOSIS — M519 Unspecified thoracic, thoracolumbar and lumbosacral intervertebral disc disorder: Secondary | ICD-10-CM | POA: Diagnosis not present

## 2023-08-25 DIAGNOSIS — R2681 Unsteadiness on feet: Secondary | ICD-10-CM | POA: Diagnosis not present

## 2023-08-25 DIAGNOSIS — M6281 Muscle weakness (generalized): Secondary | ICD-10-CM | POA: Diagnosis not present

## 2023-08-25 DIAGNOSIS — M545 Low back pain, unspecified: Secondary | ICD-10-CM | POA: Diagnosis not present

## 2023-08-30 DIAGNOSIS — L853 Xerosis cutis: Secondary | ICD-10-CM | POA: Diagnosis not present

## 2023-08-30 DIAGNOSIS — Z85828 Personal history of other malignant neoplasm of skin: Secondary | ICD-10-CM | POA: Diagnosis not present

## 2023-08-30 DIAGNOSIS — M545 Low back pain, unspecified: Secondary | ICD-10-CM | POA: Diagnosis not present

## 2023-08-30 DIAGNOSIS — L820 Inflamed seborrheic keratosis: Secondary | ICD-10-CM | POA: Diagnosis not present

## 2023-08-30 DIAGNOSIS — M6281 Muscle weakness (generalized): Secondary | ICD-10-CM | POA: Diagnosis not present

## 2023-08-30 DIAGNOSIS — M519 Unspecified thoracic, thoracolumbar and lumbosacral intervertebral disc disorder: Secondary | ICD-10-CM | POA: Diagnosis not present

## 2023-08-30 DIAGNOSIS — R2681 Unsteadiness on feet: Secondary | ICD-10-CM | POA: Diagnosis not present

## 2023-08-30 DIAGNOSIS — L821 Other seborrheic keratosis: Secondary | ICD-10-CM | POA: Diagnosis not present

## 2023-08-30 DIAGNOSIS — L209 Atopic dermatitis, unspecified: Secondary | ICD-10-CM | POA: Diagnosis not present

## 2023-08-30 DIAGNOSIS — L570 Actinic keratosis: Secondary | ICD-10-CM | POA: Diagnosis not present

## 2023-08-30 DIAGNOSIS — L905 Scar conditions and fibrosis of skin: Secondary | ICD-10-CM | POA: Diagnosis not present

## 2023-09-01 DIAGNOSIS — M6281 Muscle weakness (generalized): Secondary | ICD-10-CM | POA: Diagnosis not present

## 2023-09-01 DIAGNOSIS — M545 Low back pain, unspecified: Secondary | ICD-10-CM | POA: Diagnosis not present

## 2023-09-01 DIAGNOSIS — M519 Unspecified thoracic, thoracolumbar and lumbosacral intervertebral disc disorder: Secondary | ICD-10-CM | POA: Diagnosis not present

## 2023-09-01 DIAGNOSIS — R2681 Unsteadiness on feet: Secondary | ICD-10-CM | POA: Diagnosis not present

## 2023-09-06 DIAGNOSIS — E1169 Type 2 diabetes mellitus with other specified complication: Secondary | ICD-10-CM | POA: Diagnosis not present

## 2023-09-06 DIAGNOSIS — K219 Gastro-esophageal reflux disease without esophagitis: Secondary | ICD-10-CM | POA: Diagnosis not present

## 2023-09-06 DIAGNOSIS — Z299 Encounter for prophylactic measures, unspecified: Secondary | ICD-10-CM | POA: Diagnosis not present

## 2023-09-06 DIAGNOSIS — I10 Essential (primary) hypertension: Secondary | ICD-10-CM | POA: Diagnosis not present

## 2023-09-06 DIAGNOSIS — R1084 Generalized abdominal pain: Secondary | ICD-10-CM | POA: Diagnosis not present

## 2023-09-06 DIAGNOSIS — Z6831 Body mass index (BMI) 31.0-31.9, adult: Secondary | ICD-10-CM | POA: Diagnosis not present

## 2023-09-07 DIAGNOSIS — M6281 Muscle weakness (generalized): Secondary | ICD-10-CM | POA: Diagnosis not present

## 2023-09-07 DIAGNOSIS — R2681 Unsteadiness on feet: Secondary | ICD-10-CM | POA: Diagnosis not present

## 2023-09-07 DIAGNOSIS — M519 Unspecified thoracic, thoracolumbar and lumbosacral intervertebral disc disorder: Secondary | ICD-10-CM | POA: Diagnosis not present

## 2023-09-07 DIAGNOSIS — M545 Low back pain, unspecified: Secondary | ICD-10-CM | POA: Diagnosis not present

## 2023-09-09 DIAGNOSIS — M545 Low back pain, unspecified: Secondary | ICD-10-CM | POA: Diagnosis not present

## 2023-09-09 DIAGNOSIS — R2681 Unsteadiness on feet: Secondary | ICD-10-CM | POA: Diagnosis not present

## 2023-09-09 DIAGNOSIS — M519 Unspecified thoracic, thoracolumbar and lumbosacral intervertebral disc disorder: Secondary | ICD-10-CM | POA: Diagnosis not present

## 2023-09-09 DIAGNOSIS — M6281 Muscle weakness (generalized): Secondary | ICD-10-CM | POA: Diagnosis not present

## 2023-09-14 DIAGNOSIS — R2681 Unsteadiness on feet: Secondary | ICD-10-CM | POA: Diagnosis not present

## 2023-09-14 DIAGNOSIS — M545 Low back pain, unspecified: Secondary | ICD-10-CM | POA: Diagnosis not present

## 2023-09-14 DIAGNOSIS — M6281 Muscle weakness (generalized): Secondary | ICD-10-CM | POA: Diagnosis not present

## 2023-09-14 DIAGNOSIS — M519 Unspecified thoracic, thoracolumbar and lumbosacral intervertebral disc disorder: Secondary | ICD-10-CM | POA: Diagnosis not present

## 2023-09-15 DIAGNOSIS — M519 Unspecified thoracic, thoracolumbar and lumbosacral intervertebral disc disorder: Secondary | ICD-10-CM | POA: Diagnosis not present

## 2023-09-15 DIAGNOSIS — M6281 Muscle weakness (generalized): Secondary | ICD-10-CM | POA: Diagnosis not present

## 2023-09-15 DIAGNOSIS — M545 Low back pain, unspecified: Secondary | ICD-10-CM | POA: Diagnosis not present

## 2023-09-15 DIAGNOSIS — R2681 Unsteadiness on feet: Secondary | ICD-10-CM | POA: Diagnosis not present

## 2023-09-16 DIAGNOSIS — M5136 Other intervertebral disc degeneration, lumbar region with discogenic back pain only: Secondary | ICD-10-CM | POA: Diagnosis not present

## 2023-09-16 DIAGNOSIS — M5412 Radiculopathy, cervical region: Secondary | ICD-10-CM | POA: Diagnosis not present

## 2023-09-16 DIAGNOSIS — M5134 Other intervertebral disc degeneration, thoracic region: Secondary | ICD-10-CM | POA: Diagnosis not present

## 2023-09-16 DIAGNOSIS — M9901 Segmental and somatic dysfunction of cervical region: Secondary | ICD-10-CM | POA: Diagnosis not present

## 2023-09-16 DIAGNOSIS — M50322 Other cervical disc degeneration at C5-C6 level: Secondary | ICD-10-CM | POA: Diagnosis not present

## 2023-09-16 DIAGNOSIS — M9903 Segmental and somatic dysfunction of lumbar region: Secondary | ICD-10-CM | POA: Diagnosis not present

## 2023-09-16 DIAGNOSIS — M9902 Segmental and somatic dysfunction of thoracic region: Secondary | ICD-10-CM | POA: Diagnosis not present

## 2023-09-20 DIAGNOSIS — M5136 Other intervertebral disc degeneration, lumbar region with discogenic back pain only: Secondary | ICD-10-CM | POA: Diagnosis not present

## 2023-09-20 DIAGNOSIS — M9901 Segmental and somatic dysfunction of cervical region: Secondary | ICD-10-CM | POA: Diagnosis not present

## 2023-09-20 DIAGNOSIS — M5412 Radiculopathy, cervical region: Secondary | ICD-10-CM | POA: Diagnosis not present

## 2023-09-20 DIAGNOSIS — M6281 Muscle weakness (generalized): Secondary | ICD-10-CM | POA: Diagnosis not present

## 2023-09-20 DIAGNOSIS — M50322 Other cervical disc degeneration at C5-C6 level: Secondary | ICD-10-CM | POA: Diagnosis not present

## 2023-09-20 DIAGNOSIS — M519 Unspecified thoracic, thoracolumbar and lumbosacral intervertebral disc disorder: Secondary | ICD-10-CM | POA: Diagnosis not present

## 2023-09-20 DIAGNOSIS — M5134 Other intervertebral disc degeneration, thoracic region: Secondary | ICD-10-CM | POA: Diagnosis not present

## 2023-09-20 DIAGNOSIS — M9902 Segmental and somatic dysfunction of thoracic region: Secondary | ICD-10-CM | POA: Diagnosis not present

## 2023-09-20 DIAGNOSIS — R2681 Unsteadiness on feet: Secondary | ICD-10-CM | POA: Diagnosis not present

## 2023-09-20 DIAGNOSIS — M545 Low back pain, unspecified: Secondary | ICD-10-CM | POA: Diagnosis not present

## 2023-09-20 DIAGNOSIS — M9903 Segmental and somatic dysfunction of lumbar region: Secondary | ICD-10-CM | POA: Diagnosis not present

## 2023-09-21 DIAGNOSIS — E1140 Type 2 diabetes mellitus with diabetic neuropathy, unspecified: Secondary | ICD-10-CM | POA: Diagnosis not present

## 2023-09-21 DIAGNOSIS — E1151 Type 2 diabetes mellitus with diabetic peripheral angiopathy without gangrene: Secondary | ICD-10-CM | POA: Diagnosis not present

## 2023-09-23 DIAGNOSIS — M50322 Other cervical disc degeneration at C5-C6 level: Secondary | ICD-10-CM | POA: Diagnosis not present

## 2023-09-23 DIAGNOSIS — M9903 Segmental and somatic dysfunction of lumbar region: Secondary | ICD-10-CM | POA: Diagnosis not present

## 2023-09-23 DIAGNOSIS — M5136 Other intervertebral disc degeneration, lumbar region with discogenic back pain only: Secondary | ICD-10-CM | POA: Diagnosis not present

## 2023-09-23 DIAGNOSIS — M5134 Other intervertebral disc degeneration, thoracic region: Secondary | ICD-10-CM | POA: Diagnosis not present

## 2023-09-23 DIAGNOSIS — M9901 Segmental and somatic dysfunction of cervical region: Secondary | ICD-10-CM | POA: Diagnosis not present

## 2023-09-23 DIAGNOSIS — M5412 Radiculopathy, cervical region: Secondary | ICD-10-CM | POA: Diagnosis not present

## 2023-09-23 DIAGNOSIS — M519 Unspecified thoracic, thoracolumbar and lumbosacral intervertebral disc disorder: Secondary | ICD-10-CM | POA: Diagnosis not present

## 2023-09-23 DIAGNOSIS — M6281 Muscle weakness (generalized): Secondary | ICD-10-CM | POA: Diagnosis not present

## 2023-09-23 DIAGNOSIS — M9902 Segmental and somatic dysfunction of thoracic region: Secondary | ICD-10-CM | POA: Diagnosis not present

## 2023-09-23 DIAGNOSIS — M545 Low back pain, unspecified: Secondary | ICD-10-CM | POA: Diagnosis not present

## 2023-09-23 DIAGNOSIS — R2681 Unsteadiness on feet: Secondary | ICD-10-CM | POA: Diagnosis not present

## 2023-09-27 DIAGNOSIS — M519 Unspecified thoracic, thoracolumbar and lumbosacral intervertebral disc disorder: Secondary | ICD-10-CM | POA: Diagnosis not present

## 2023-09-27 DIAGNOSIS — M50322 Other cervical disc degeneration at C5-C6 level: Secondary | ICD-10-CM | POA: Diagnosis not present

## 2023-09-27 DIAGNOSIS — R2681 Unsteadiness on feet: Secondary | ICD-10-CM | POA: Diagnosis not present

## 2023-09-27 DIAGNOSIS — M9902 Segmental and somatic dysfunction of thoracic region: Secondary | ICD-10-CM | POA: Diagnosis not present

## 2023-09-27 DIAGNOSIS — M9903 Segmental and somatic dysfunction of lumbar region: Secondary | ICD-10-CM | POA: Diagnosis not present

## 2023-09-27 DIAGNOSIS — M5412 Radiculopathy, cervical region: Secondary | ICD-10-CM | POA: Diagnosis not present

## 2023-09-27 DIAGNOSIS — M9901 Segmental and somatic dysfunction of cervical region: Secondary | ICD-10-CM | POA: Diagnosis not present

## 2023-09-27 DIAGNOSIS — M5134 Other intervertebral disc degeneration, thoracic region: Secondary | ICD-10-CM | POA: Diagnosis not present

## 2023-09-27 DIAGNOSIS — M5136 Other intervertebral disc degeneration, lumbar region with discogenic back pain only: Secondary | ICD-10-CM | POA: Diagnosis not present

## 2023-09-27 DIAGNOSIS — M6281 Muscle weakness (generalized): Secondary | ICD-10-CM | POA: Diagnosis not present

## 2023-09-27 DIAGNOSIS — M545 Low back pain, unspecified: Secondary | ICD-10-CM | POA: Diagnosis not present

## 2023-10-04 DIAGNOSIS — M6281 Muscle weakness (generalized): Secondary | ICD-10-CM | POA: Diagnosis not present

## 2023-10-04 DIAGNOSIS — R2681 Unsteadiness on feet: Secondary | ICD-10-CM | POA: Diagnosis not present

## 2023-10-04 DIAGNOSIS — M519 Unspecified thoracic, thoracolumbar and lumbosacral intervertebral disc disorder: Secondary | ICD-10-CM | POA: Diagnosis not present

## 2023-10-04 DIAGNOSIS — M545 Low back pain, unspecified: Secondary | ICD-10-CM | POA: Diagnosis not present

## 2023-10-06 DIAGNOSIS — M545 Low back pain, unspecified: Secondary | ICD-10-CM | POA: Diagnosis not present

## 2023-10-06 DIAGNOSIS — M6281 Muscle weakness (generalized): Secondary | ICD-10-CM | POA: Diagnosis not present

## 2023-10-06 DIAGNOSIS — R2681 Unsteadiness on feet: Secondary | ICD-10-CM | POA: Diagnosis not present

## 2023-10-06 DIAGNOSIS — M519 Unspecified thoracic, thoracolumbar and lumbosacral intervertebral disc disorder: Secondary | ICD-10-CM | POA: Diagnosis not present

## 2023-10-07 DIAGNOSIS — M50322 Other cervical disc degeneration at C5-C6 level: Secondary | ICD-10-CM | POA: Diagnosis not present

## 2023-10-07 DIAGNOSIS — M5412 Radiculopathy, cervical region: Secondary | ICD-10-CM | POA: Diagnosis not present

## 2023-10-07 DIAGNOSIS — M9903 Segmental and somatic dysfunction of lumbar region: Secondary | ICD-10-CM | POA: Diagnosis not present

## 2023-10-07 DIAGNOSIS — M5134 Other intervertebral disc degeneration, thoracic region: Secondary | ICD-10-CM | POA: Diagnosis not present

## 2023-10-07 DIAGNOSIS — M9901 Segmental and somatic dysfunction of cervical region: Secondary | ICD-10-CM | POA: Diagnosis not present

## 2023-10-07 DIAGNOSIS — M5136 Other intervertebral disc degeneration, lumbar region with discogenic back pain only: Secondary | ICD-10-CM | POA: Diagnosis not present

## 2023-10-07 DIAGNOSIS — M9902 Segmental and somatic dysfunction of thoracic region: Secondary | ICD-10-CM | POA: Diagnosis not present

## 2023-10-12 DIAGNOSIS — M5412 Radiculopathy, cervical region: Secondary | ICD-10-CM | POA: Diagnosis not present

## 2023-10-12 DIAGNOSIS — M5134 Other intervertebral disc degeneration, thoracic region: Secondary | ICD-10-CM | POA: Diagnosis not present

## 2023-10-12 DIAGNOSIS — M50322 Other cervical disc degeneration at C5-C6 level: Secondary | ICD-10-CM | POA: Diagnosis not present

## 2023-10-12 DIAGNOSIS — M9902 Segmental and somatic dysfunction of thoracic region: Secondary | ICD-10-CM | POA: Diagnosis not present

## 2023-10-12 DIAGNOSIS — M5136 Other intervertebral disc degeneration, lumbar region with discogenic back pain only: Secondary | ICD-10-CM | POA: Diagnosis not present

## 2023-10-12 DIAGNOSIS — M9903 Segmental and somatic dysfunction of lumbar region: Secondary | ICD-10-CM | POA: Diagnosis not present

## 2023-10-12 DIAGNOSIS — M9901 Segmental and somatic dysfunction of cervical region: Secondary | ICD-10-CM | POA: Diagnosis not present

## 2023-10-14 DIAGNOSIS — M5134 Other intervertebral disc degeneration, thoracic region: Secondary | ICD-10-CM | POA: Diagnosis not present

## 2023-10-14 DIAGNOSIS — M9902 Segmental and somatic dysfunction of thoracic region: Secondary | ICD-10-CM | POA: Diagnosis not present

## 2023-10-14 DIAGNOSIS — M9901 Segmental and somatic dysfunction of cervical region: Secondary | ICD-10-CM | POA: Diagnosis not present

## 2023-10-14 DIAGNOSIS — M9903 Segmental and somatic dysfunction of lumbar region: Secondary | ICD-10-CM | POA: Diagnosis not present

## 2023-10-14 DIAGNOSIS — M5412 Radiculopathy, cervical region: Secondary | ICD-10-CM | POA: Diagnosis not present

## 2023-10-14 DIAGNOSIS — M50322 Other cervical disc degeneration at C5-C6 level: Secondary | ICD-10-CM | POA: Diagnosis not present

## 2023-10-14 DIAGNOSIS — M5136 Other intervertebral disc degeneration, lumbar region with discogenic back pain only: Secondary | ICD-10-CM | POA: Diagnosis not present

## 2023-10-18 DIAGNOSIS — M519 Unspecified thoracic, thoracolumbar and lumbosacral intervertebral disc disorder: Secondary | ICD-10-CM | POA: Diagnosis not present

## 2023-10-18 DIAGNOSIS — M6281 Muscle weakness (generalized): Secondary | ICD-10-CM | POA: Diagnosis not present

## 2023-10-18 DIAGNOSIS — R2681 Unsteadiness on feet: Secondary | ICD-10-CM | POA: Diagnosis not present

## 2023-10-18 DIAGNOSIS — M545 Low back pain, unspecified: Secondary | ICD-10-CM | POA: Diagnosis not present

## 2023-10-19 DIAGNOSIS — M5412 Radiculopathy, cervical region: Secondary | ICD-10-CM | POA: Diagnosis not present

## 2023-10-19 DIAGNOSIS — M5136 Other intervertebral disc degeneration, lumbar region with discogenic back pain only: Secondary | ICD-10-CM | POA: Diagnosis not present

## 2023-10-19 DIAGNOSIS — M9903 Segmental and somatic dysfunction of lumbar region: Secondary | ICD-10-CM | POA: Diagnosis not present

## 2023-10-19 DIAGNOSIS — M5134 Other intervertebral disc degeneration, thoracic region: Secondary | ICD-10-CM | POA: Diagnosis not present

## 2023-10-19 DIAGNOSIS — M50322 Other cervical disc degeneration at C5-C6 level: Secondary | ICD-10-CM | POA: Diagnosis not present

## 2023-10-19 DIAGNOSIS — M9902 Segmental and somatic dysfunction of thoracic region: Secondary | ICD-10-CM | POA: Diagnosis not present

## 2023-10-19 DIAGNOSIS — M9901 Segmental and somatic dysfunction of cervical region: Secondary | ICD-10-CM | POA: Diagnosis not present

## 2023-10-20 DIAGNOSIS — M9902 Segmental and somatic dysfunction of thoracic region: Secondary | ICD-10-CM | POA: Diagnosis not present

## 2023-10-20 DIAGNOSIS — M50322 Other cervical disc degeneration at C5-C6 level: Secondary | ICD-10-CM | POA: Diagnosis not present

## 2023-10-20 DIAGNOSIS — M5134 Other intervertebral disc degeneration, thoracic region: Secondary | ICD-10-CM | POA: Diagnosis not present

## 2023-10-20 DIAGNOSIS — M9901 Segmental and somatic dysfunction of cervical region: Secondary | ICD-10-CM | POA: Diagnosis not present

## 2023-10-20 DIAGNOSIS — M5412 Radiculopathy, cervical region: Secondary | ICD-10-CM | POA: Diagnosis not present

## 2023-10-20 DIAGNOSIS — M5136 Other intervertebral disc degeneration, lumbar region with discogenic back pain only: Secondary | ICD-10-CM | POA: Diagnosis not present

## 2023-10-20 DIAGNOSIS — M9903 Segmental and somatic dysfunction of lumbar region: Secondary | ICD-10-CM | POA: Diagnosis not present

## 2023-10-21 DIAGNOSIS — M6281 Muscle weakness (generalized): Secondary | ICD-10-CM | POA: Diagnosis not present

## 2023-10-21 DIAGNOSIS — R2681 Unsteadiness on feet: Secondary | ICD-10-CM | POA: Diagnosis not present

## 2023-10-21 DIAGNOSIS — M545 Low back pain, unspecified: Secondary | ICD-10-CM | POA: Diagnosis not present

## 2023-10-21 DIAGNOSIS — M519 Unspecified thoracic, thoracolumbar and lumbosacral intervertebral disc disorder: Secondary | ICD-10-CM | POA: Diagnosis not present

## 2023-10-25 DIAGNOSIS — M79675 Pain in left toe(s): Secondary | ICD-10-CM | POA: Diagnosis not present

## 2023-10-25 DIAGNOSIS — M79672 Pain in left foot: Secondary | ICD-10-CM | POA: Diagnosis not present

## 2023-10-25 DIAGNOSIS — L110 Acquired keratosis follicularis: Secondary | ICD-10-CM | POA: Diagnosis not present

## 2023-10-25 DIAGNOSIS — M25775 Osteophyte, left foot: Secondary | ICD-10-CM | POA: Diagnosis not present

## 2023-10-26 DIAGNOSIS — M5136 Other intervertebral disc degeneration, lumbar region with discogenic back pain only: Secondary | ICD-10-CM | POA: Diagnosis not present

## 2023-10-26 DIAGNOSIS — M5412 Radiculopathy, cervical region: Secondary | ICD-10-CM | POA: Diagnosis not present

## 2023-10-26 DIAGNOSIS — M6281 Muscle weakness (generalized): Secondary | ICD-10-CM | POA: Diagnosis not present

## 2023-10-26 DIAGNOSIS — M50322 Other cervical disc degeneration at C5-C6 level: Secondary | ICD-10-CM | POA: Diagnosis not present

## 2023-10-26 DIAGNOSIS — M545 Low back pain, unspecified: Secondary | ICD-10-CM | POA: Diagnosis not present

## 2023-10-26 DIAGNOSIS — R2681 Unsteadiness on feet: Secondary | ICD-10-CM | POA: Diagnosis not present

## 2023-10-26 DIAGNOSIS — M9903 Segmental and somatic dysfunction of lumbar region: Secondary | ICD-10-CM | POA: Diagnosis not present

## 2023-10-26 DIAGNOSIS — M5134 Other intervertebral disc degeneration, thoracic region: Secondary | ICD-10-CM | POA: Diagnosis not present

## 2023-10-26 DIAGNOSIS — M9901 Segmental and somatic dysfunction of cervical region: Secondary | ICD-10-CM | POA: Diagnosis not present

## 2023-10-26 DIAGNOSIS — M9902 Segmental and somatic dysfunction of thoracic region: Secondary | ICD-10-CM | POA: Diagnosis not present

## 2023-10-26 DIAGNOSIS — M519 Unspecified thoracic, thoracolumbar and lumbosacral intervertebral disc disorder: Secondary | ICD-10-CM | POA: Diagnosis not present

## 2023-10-28 DIAGNOSIS — M50322 Other cervical disc degeneration at C5-C6 level: Secondary | ICD-10-CM | POA: Diagnosis not present

## 2023-10-28 DIAGNOSIS — M9902 Segmental and somatic dysfunction of thoracic region: Secondary | ICD-10-CM | POA: Diagnosis not present

## 2023-10-28 DIAGNOSIS — M5134 Other intervertebral disc degeneration, thoracic region: Secondary | ICD-10-CM | POA: Diagnosis not present

## 2023-10-28 DIAGNOSIS — M9901 Segmental and somatic dysfunction of cervical region: Secondary | ICD-10-CM | POA: Diagnosis not present

## 2023-10-28 DIAGNOSIS — M5412 Radiculopathy, cervical region: Secondary | ICD-10-CM | POA: Diagnosis not present

## 2023-10-28 DIAGNOSIS — M9903 Segmental and somatic dysfunction of lumbar region: Secondary | ICD-10-CM | POA: Diagnosis not present

## 2023-10-28 DIAGNOSIS — M5136 Other intervertebral disc degeneration, lumbar region with discogenic back pain only: Secondary | ICD-10-CM | POA: Diagnosis not present

## 2023-11-01 DIAGNOSIS — M50322 Other cervical disc degeneration at C5-C6 level: Secondary | ICD-10-CM | POA: Diagnosis not present

## 2023-11-01 DIAGNOSIS — M5134 Other intervertebral disc degeneration, thoracic region: Secondary | ICD-10-CM | POA: Diagnosis not present

## 2023-11-01 DIAGNOSIS — M9901 Segmental and somatic dysfunction of cervical region: Secondary | ICD-10-CM | POA: Diagnosis not present

## 2023-11-01 DIAGNOSIS — M5136 Other intervertebral disc degeneration, lumbar region with discogenic back pain only: Secondary | ICD-10-CM | POA: Diagnosis not present

## 2023-11-01 DIAGNOSIS — M9903 Segmental and somatic dysfunction of lumbar region: Secondary | ICD-10-CM | POA: Diagnosis not present

## 2023-11-01 DIAGNOSIS — M5412 Radiculopathy, cervical region: Secondary | ICD-10-CM | POA: Diagnosis not present

## 2023-11-01 DIAGNOSIS — M9902 Segmental and somatic dysfunction of thoracic region: Secondary | ICD-10-CM | POA: Diagnosis not present

## 2023-11-02 DIAGNOSIS — R197 Diarrhea, unspecified: Secondary | ICD-10-CM | POA: Diagnosis not present

## 2023-11-02 DIAGNOSIS — M545 Low back pain, unspecified: Secondary | ICD-10-CM | POA: Diagnosis not present

## 2023-11-02 DIAGNOSIS — R2681 Unsteadiness on feet: Secondary | ICD-10-CM | POA: Diagnosis not present

## 2023-11-02 DIAGNOSIS — M519 Unspecified thoracic, thoracolumbar and lumbosacral intervertebral disc disorder: Secondary | ICD-10-CM | POA: Diagnosis not present

## 2023-11-02 DIAGNOSIS — M6281 Muscle weakness (generalized): Secondary | ICD-10-CM | POA: Diagnosis not present

## 2023-11-05 DIAGNOSIS — M9901 Segmental and somatic dysfunction of cervical region: Secondary | ICD-10-CM | POA: Diagnosis not present

## 2023-11-05 DIAGNOSIS — M5136 Other intervertebral disc degeneration, lumbar region with discogenic back pain only: Secondary | ICD-10-CM | POA: Diagnosis not present

## 2023-11-05 DIAGNOSIS — M5134 Other intervertebral disc degeneration, thoracic region: Secondary | ICD-10-CM | POA: Diagnosis not present

## 2023-11-05 DIAGNOSIS — M9902 Segmental and somatic dysfunction of thoracic region: Secondary | ICD-10-CM | POA: Diagnosis not present

## 2023-11-05 DIAGNOSIS — M50322 Other cervical disc degeneration at C5-C6 level: Secondary | ICD-10-CM | POA: Diagnosis not present

## 2023-11-05 DIAGNOSIS — M9903 Segmental and somatic dysfunction of lumbar region: Secondary | ICD-10-CM | POA: Diagnosis not present

## 2023-11-05 DIAGNOSIS — M5412 Radiculopathy, cervical region: Secondary | ICD-10-CM | POA: Diagnosis not present

## 2023-11-08 DIAGNOSIS — M6281 Muscle weakness (generalized): Secondary | ICD-10-CM | POA: Diagnosis not present

## 2023-11-08 DIAGNOSIS — M545 Low back pain, unspecified: Secondary | ICD-10-CM | POA: Diagnosis not present

## 2023-11-08 DIAGNOSIS — M519 Unspecified thoracic, thoracolumbar and lumbosacral intervertebral disc disorder: Secondary | ICD-10-CM | POA: Diagnosis not present

## 2023-11-08 DIAGNOSIS — R2681 Unsteadiness on feet: Secondary | ICD-10-CM | POA: Diagnosis not present

## 2023-11-11 DIAGNOSIS — C61 Malignant neoplasm of prostate: Secondary | ICD-10-CM | POA: Diagnosis not present

## 2023-11-12 DIAGNOSIS — M5134 Other intervertebral disc degeneration, thoracic region: Secondary | ICD-10-CM | POA: Diagnosis not present

## 2023-11-12 DIAGNOSIS — M5136 Other intervertebral disc degeneration, lumbar region with discogenic back pain only: Secondary | ICD-10-CM | POA: Diagnosis not present

## 2023-11-12 DIAGNOSIS — M50322 Other cervical disc degeneration at C5-C6 level: Secondary | ICD-10-CM | POA: Diagnosis not present

## 2023-11-12 DIAGNOSIS — M9903 Segmental and somatic dysfunction of lumbar region: Secondary | ICD-10-CM | POA: Diagnosis not present

## 2023-11-12 DIAGNOSIS — M9901 Segmental and somatic dysfunction of cervical region: Secondary | ICD-10-CM | POA: Diagnosis not present

## 2023-11-12 DIAGNOSIS — M5412 Radiculopathy, cervical region: Secondary | ICD-10-CM | POA: Diagnosis not present

## 2023-11-12 DIAGNOSIS — M9902 Segmental and somatic dysfunction of thoracic region: Secondary | ICD-10-CM | POA: Diagnosis not present

## 2023-11-16 DIAGNOSIS — M9903 Segmental and somatic dysfunction of lumbar region: Secondary | ICD-10-CM | POA: Diagnosis not present

## 2023-11-16 DIAGNOSIS — Z299 Encounter for prophylactic measures, unspecified: Secondary | ICD-10-CM | POA: Diagnosis not present

## 2023-11-16 DIAGNOSIS — M5134 Other intervertebral disc degeneration, thoracic region: Secondary | ICD-10-CM | POA: Diagnosis not present

## 2023-11-16 DIAGNOSIS — K219 Gastro-esophageal reflux disease without esophagitis: Secondary | ICD-10-CM | POA: Diagnosis not present

## 2023-11-16 DIAGNOSIS — E1169 Type 2 diabetes mellitus with other specified complication: Secondary | ICD-10-CM | POA: Diagnosis not present

## 2023-11-16 DIAGNOSIS — R197 Diarrhea, unspecified: Secondary | ICD-10-CM | POA: Diagnosis not present

## 2023-11-16 DIAGNOSIS — M9901 Segmental and somatic dysfunction of cervical region: Secondary | ICD-10-CM | POA: Diagnosis not present

## 2023-11-16 DIAGNOSIS — M5412 Radiculopathy, cervical region: Secondary | ICD-10-CM | POA: Diagnosis not present

## 2023-11-16 DIAGNOSIS — M9902 Segmental and somatic dysfunction of thoracic region: Secondary | ICD-10-CM | POA: Diagnosis not present

## 2023-11-16 DIAGNOSIS — M50322 Other cervical disc degeneration at C5-C6 level: Secondary | ICD-10-CM | POA: Diagnosis not present

## 2023-11-16 DIAGNOSIS — M5136 Other intervertebral disc degeneration, lumbar region with discogenic back pain only: Secondary | ICD-10-CM | POA: Diagnosis not present

## 2023-11-18 DIAGNOSIS — M9903 Segmental and somatic dysfunction of lumbar region: Secondary | ICD-10-CM | POA: Diagnosis not present

## 2023-11-18 DIAGNOSIS — F5221 Male erectile disorder: Secondary | ICD-10-CM | POA: Diagnosis not present

## 2023-11-18 DIAGNOSIS — M9901 Segmental and somatic dysfunction of cervical region: Secondary | ICD-10-CM | POA: Diagnosis not present

## 2023-11-18 DIAGNOSIS — M5412 Radiculopathy, cervical region: Secondary | ICD-10-CM | POA: Diagnosis not present

## 2023-11-18 DIAGNOSIS — M50322 Other cervical disc degeneration at C5-C6 level: Secondary | ICD-10-CM | POA: Diagnosis not present

## 2023-11-18 DIAGNOSIS — M9902 Segmental and somatic dysfunction of thoracic region: Secondary | ICD-10-CM | POA: Diagnosis not present

## 2023-11-18 DIAGNOSIS — M5136 Other intervertebral disc degeneration, lumbar region with discogenic back pain only: Secondary | ICD-10-CM | POA: Diagnosis not present

## 2023-11-18 DIAGNOSIS — M5134 Other intervertebral disc degeneration, thoracic region: Secondary | ICD-10-CM | POA: Diagnosis not present

## 2023-11-18 DIAGNOSIS — N401 Enlarged prostate with lower urinary tract symptoms: Secondary | ICD-10-CM | POA: Diagnosis not present

## 2023-11-18 DIAGNOSIS — C61 Malignant neoplasm of prostate: Secondary | ICD-10-CM | POA: Diagnosis not present

## 2023-11-23 DIAGNOSIS — M5412 Radiculopathy, cervical region: Secondary | ICD-10-CM | POA: Diagnosis not present

## 2023-11-23 DIAGNOSIS — M9902 Segmental and somatic dysfunction of thoracic region: Secondary | ICD-10-CM | POA: Diagnosis not present

## 2023-11-23 DIAGNOSIS — M5134 Other intervertebral disc degeneration, thoracic region: Secondary | ICD-10-CM | POA: Diagnosis not present

## 2023-11-23 DIAGNOSIS — M9901 Segmental and somatic dysfunction of cervical region: Secondary | ICD-10-CM | POA: Diagnosis not present

## 2023-11-23 DIAGNOSIS — M50322 Other cervical disc degeneration at C5-C6 level: Secondary | ICD-10-CM | POA: Diagnosis not present

## 2023-11-23 DIAGNOSIS — M5136 Other intervertebral disc degeneration, lumbar region with discogenic back pain only: Secondary | ICD-10-CM | POA: Diagnosis not present

## 2023-11-23 DIAGNOSIS — M9903 Segmental and somatic dysfunction of lumbar region: Secondary | ICD-10-CM | POA: Diagnosis not present

## 2023-11-24 DIAGNOSIS — M519 Unspecified thoracic, thoracolumbar and lumbosacral intervertebral disc disorder: Secondary | ICD-10-CM | POA: Diagnosis not present

## 2023-11-24 DIAGNOSIS — R2681 Unsteadiness on feet: Secondary | ICD-10-CM | POA: Diagnosis not present

## 2023-11-24 DIAGNOSIS — M545 Low back pain, unspecified: Secondary | ICD-10-CM | POA: Diagnosis not present

## 2023-11-24 DIAGNOSIS — M6281 Muscle weakness (generalized): Secondary | ICD-10-CM | POA: Diagnosis not present

## 2023-11-25 DIAGNOSIS — M9901 Segmental and somatic dysfunction of cervical region: Secondary | ICD-10-CM | POA: Diagnosis not present

## 2023-11-25 DIAGNOSIS — M5134 Other intervertebral disc degeneration, thoracic region: Secondary | ICD-10-CM | POA: Diagnosis not present

## 2023-11-25 DIAGNOSIS — M9902 Segmental and somatic dysfunction of thoracic region: Secondary | ICD-10-CM | POA: Diagnosis not present

## 2023-11-25 DIAGNOSIS — M5136 Other intervertebral disc degeneration, lumbar region with discogenic back pain only: Secondary | ICD-10-CM | POA: Diagnosis not present

## 2023-11-25 DIAGNOSIS — M50322 Other cervical disc degeneration at C5-C6 level: Secondary | ICD-10-CM | POA: Diagnosis not present

## 2023-11-25 DIAGNOSIS — M9903 Segmental and somatic dysfunction of lumbar region: Secondary | ICD-10-CM | POA: Diagnosis not present

## 2023-11-25 DIAGNOSIS — M5412 Radiculopathy, cervical region: Secondary | ICD-10-CM | POA: Diagnosis not present

## 2023-11-29 DIAGNOSIS — M5412 Radiculopathy, cervical region: Secondary | ICD-10-CM | POA: Diagnosis not present

## 2023-11-29 DIAGNOSIS — M9901 Segmental and somatic dysfunction of cervical region: Secondary | ICD-10-CM | POA: Diagnosis not present

## 2023-11-29 DIAGNOSIS — M9902 Segmental and somatic dysfunction of thoracic region: Secondary | ICD-10-CM | POA: Diagnosis not present

## 2023-11-29 DIAGNOSIS — M9903 Segmental and somatic dysfunction of lumbar region: Secondary | ICD-10-CM | POA: Diagnosis not present

## 2023-11-29 DIAGNOSIS — M50322 Other cervical disc degeneration at C5-C6 level: Secondary | ICD-10-CM | POA: Diagnosis not present

## 2023-11-29 DIAGNOSIS — M5136 Other intervertebral disc degeneration, lumbar region with discogenic back pain only: Secondary | ICD-10-CM | POA: Diagnosis not present

## 2023-11-29 DIAGNOSIS — M5134 Other intervertebral disc degeneration, thoracic region: Secondary | ICD-10-CM | POA: Diagnosis not present

## 2023-12-03 DIAGNOSIS — M5136 Other intervertebral disc degeneration, lumbar region with discogenic back pain only: Secondary | ICD-10-CM | POA: Diagnosis not present

## 2023-12-03 DIAGNOSIS — M9902 Segmental and somatic dysfunction of thoracic region: Secondary | ICD-10-CM | POA: Diagnosis not present

## 2023-12-03 DIAGNOSIS — M5134 Other intervertebral disc degeneration, thoracic region: Secondary | ICD-10-CM | POA: Diagnosis not present

## 2023-12-03 DIAGNOSIS — M50322 Other cervical disc degeneration at C5-C6 level: Secondary | ICD-10-CM | POA: Diagnosis not present

## 2023-12-03 DIAGNOSIS — M5412 Radiculopathy, cervical region: Secondary | ICD-10-CM | POA: Diagnosis not present

## 2023-12-03 DIAGNOSIS — M9901 Segmental and somatic dysfunction of cervical region: Secondary | ICD-10-CM | POA: Diagnosis not present

## 2023-12-03 DIAGNOSIS — M9903 Segmental and somatic dysfunction of lumbar region: Secondary | ICD-10-CM | POA: Diagnosis not present

## 2023-12-07 DIAGNOSIS — E1151 Type 2 diabetes mellitus with diabetic peripheral angiopathy without gangrene: Secondary | ICD-10-CM | POA: Diagnosis not present

## 2023-12-07 DIAGNOSIS — E1140 Type 2 diabetes mellitus with diabetic neuropathy, unspecified: Secondary | ICD-10-CM | POA: Diagnosis not present

## 2023-12-09 DIAGNOSIS — E119 Type 2 diabetes mellitus without complications: Secondary | ICD-10-CM | POA: Diagnosis not present

## 2023-12-09 DIAGNOSIS — K219 Gastro-esophageal reflux disease without esophagitis: Secondary | ICD-10-CM | POA: Diagnosis not present

## 2023-12-09 DIAGNOSIS — Z860100 Personal history of colon polyps, unspecified: Secondary | ICD-10-CM | POA: Diagnosis not present

## 2023-12-09 DIAGNOSIS — Z7984 Long term (current) use of oral hypoglycemic drugs: Secondary | ICD-10-CM | POA: Diagnosis not present

## 2023-12-09 DIAGNOSIS — Z7985 Long-term (current) use of injectable non-insulin antidiabetic drugs: Secondary | ICD-10-CM | POA: Diagnosis not present

## 2023-12-09 DIAGNOSIS — E7800 Pure hypercholesterolemia, unspecified: Secondary | ICD-10-CM | POA: Diagnosis not present

## 2023-12-09 DIAGNOSIS — I10 Essential (primary) hypertension: Secondary | ICD-10-CM | POA: Diagnosis not present

## 2023-12-09 DIAGNOSIS — Z7982 Long term (current) use of aspirin: Secondary | ICD-10-CM | POA: Diagnosis not present

## 2023-12-09 DIAGNOSIS — R197 Diarrhea, unspecified: Secondary | ICD-10-CM | POA: Diagnosis not present

## 2023-12-09 DIAGNOSIS — Z87891 Personal history of nicotine dependence: Secondary | ICD-10-CM | POA: Diagnosis not present

## 2023-12-09 DIAGNOSIS — Z1211 Encounter for screening for malignant neoplasm of colon: Secondary | ICD-10-CM | POA: Diagnosis not present

## 2023-12-09 DIAGNOSIS — Z79899 Other long term (current) drug therapy: Secondary | ICD-10-CM | POA: Diagnosis not present

## 2023-12-09 DIAGNOSIS — I2510 Atherosclerotic heart disease of native coronary artery without angina pectoris: Secondary | ICD-10-CM | POA: Diagnosis not present

## 2023-12-10 DIAGNOSIS — Z299 Encounter for prophylactic measures, unspecified: Secondary | ICD-10-CM | POA: Diagnosis not present

## 2023-12-10 DIAGNOSIS — R0981 Nasal congestion: Secondary | ICD-10-CM | POA: Diagnosis not present

## 2023-12-10 DIAGNOSIS — Z6832 Body mass index (BMI) 32.0-32.9, adult: Secondary | ICD-10-CM | POA: Diagnosis not present

## 2023-12-10 DIAGNOSIS — I10 Essential (primary) hypertension: Secondary | ICD-10-CM | POA: Diagnosis not present

## 2023-12-10 DIAGNOSIS — E119 Type 2 diabetes mellitus without complications: Secondary | ICD-10-CM | POA: Diagnosis not present

## 2023-12-14 DIAGNOSIS — M519 Unspecified thoracic, thoracolumbar and lumbosacral intervertebral disc disorder: Secondary | ICD-10-CM | POA: Diagnosis not present

## 2023-12-14 DIAGNOSIS — M6281 Muscle weakness (generalized): Secondary | ICD-10-CM | POA: Diagnosis not present

## 2023-12-14 DIAGNOSIS — M545 Low back pain, unspecified: Secondary | ICD-10-CM | POA: Diagnosis not present

## 2023-12-14 DIAGNOSIS — R2681 Unsteadiness on feet: Secondary | ICD-10-CM | POA: Diagnosis not present

## 2023-12-16 DIAGNOSIS — M545 Low back pain, unspecified: Secondary | ICD-10-CM | POA: Diagnosis not present

## 2023-12-16 DIAGNOSIS — M6281 Muscle weakness (generalized): Secondary | ICD-10-CM | POA: Diagnosis not present

## 2023-12-16 DIAGNOSIS — M519 Unspecified thoracic, thoracolumbar and lumbosacral intervertebral disc disorder: Secondary | ICD-10-CM | POA: Diagnosis not present

## 2023-12-16 DIAGNOSIS — R2681 Unsteadiness on feet: Secondary | ICD-10-CM | POA: Diagnosis not present

## 2023-12-21 DIAGNOSIS — R2681 Unsteadiness on feet: Secondary | ICD-10-CM | POA: Diagnosis not present

## 2023-12-21 DIAGNOSIS — M6281 Muscle weakness (generalized): Secondary | ICD-10-CM | POA: Diagnosis not present

## 2023-12-21 DIAGNOSIS — M545 Low back pain, unspecified: Secondary | ICD-10-CM | POA: Diagnosis not present

## 2023-12-21 DIAGNOSIS — M519 Unspecified thoracic, thoracolumbar and lumbosacral intervertebral disc disorder: Secondary | ICD-10-CM | POA: Diagnosis not present

## 2023-12-23 DIAGNOSIS — M545 Low back pain, unspecified: Secondary | ICD-10-CM | POA: Diagnosis not present

## 2023-12-23 DIAGNOSIS — R2681 Unsteadiness on feet: Secondary | ICD-10-CM | POA: Diagnosis not present

## 2023-12-23 DIAGNOSIS — M519 Unspecified thoracic, thoracolumbar and lumbosacral intervertebral disc disorder: Secondary | ICD-10-CM | POA: Diagnosis not present

## 2023-12-23 DIAGNOSIS — M6281 Muscle weakness (generalized): Secondary | ICD-10-CM | POA: Diagnosis not present

## 2023-12-27 DIAGNOSIS — M519 Unspecified thoracic, thoracolumbar and lumbosacral intervertebral disc disorder: Secondary | ICD-10-CM | POA: Diagnosis not present

## 2023-12-27 DIAGNOSIS — R2681 Unsteadiness on feet: Secondary | ICD-10-CM | POA: Diagnosis not present

## 2023-12-27 DIAGNOSIS — M6281 Muscle weakness (generalized): Secondary | ICD-10-CM | POA: Diagnosis not present

## 2023-12-27 DIAGNOSIS — M545 Low back pain, unspecified: Secondary | ICD-10-CM | POA: Diagnosis not present

## 2023-12-30 DIAGNOSIS — M9901 Segmental and somatic dysfunction of cervical region: Secondary | ICD-10-CM | POA: Diagnosis not present

## 2023-12-30 DIAGNOSIS — M50322 Other cervical disc degeneration at C5-C6 level: Secondary | ICD-10-CM | POA: Diagnosis not present

## 2023-12-30 DIAGNOSIS — M9902 Segmental and somatic dysfunction of thoracic region: Secondary | ICD-10-CM | POA: Diagnosis not present

## 2023-12-30 DIAGNOSIS — M5412 Radiculopathy, cervical region: Secondary | ICD-10-CM | POA: Diagnosis not present

## 2023-12-30 DIAGNOSIS — M5134 Other intervertebral disc degeneration, thoracic region: Secondary | ICD-10-CM | POA: Diagnosis not present

## 2023-12-30 DIAGNOSIS — M5136 Other intervertebral disc degeneration, lumbar region with discogenic back pain only: Secondary | ICD-10-CM | POA: Diagnosis not present

## 2023-12-30 DIAGNOSIS — M9903 Segmental and somatic dysfunction of lumbar region: Secondary | ICD-10-CM | POA: Diagnosis not present

## 2024-01-04 DIAGNOSIS — M50322 Other cervical disc degeneration at C5-C6 level: Secondary | ICD-10-CM | POA: Diagnosis not present

## 2024-01-04 DIAGNOSIS — M5134 Other intervertebral disc degeneration, thoracic region: Secondary | ICD-10-CM | POA: Diagnosis not present

## 2024-01-04 DIAGNOSIS — M9902 Segmental and somatic dysfunction of thoracic region: Secondary | ICD-10-CM | POA: Diagnosis not present

## 2024-01-04 DIAGNOSIS — M9901 Segmental and somatic dysfunction of cervical region: Secondary | ICD-10-CM | POA: Diagnosis not present

## 2024-01-04 DIAGNOSIS — M9903 Segmental and somatic dysfunction of lumbar region: Secondary | ICD-10-CM | POA: Diagnosis not present

## 2024-01-04 DIAGNOSIS — M5136 Other intervertebral disc degeneration, lumbar region with discogenic back pain only: Secondary | ICD-10-CM | POA: Diagnosis not present

## 2024-01-04 DIAGNOSIS — M5412 Radiculopathy, cervical region: Secondary | ICD-10-CM | POA: Diagnosis not present

## 2024-01-05 DIAGNOSIS — R2681 Unsteadiness on feet: Secondary | ICD-10-CM | POA: Diagnosis not present

## 2024-01-05 DIAGNOSIS — M6281 Muscle weakness (generalized): Secondary | ICD-10-CM | POA: Diagnosis not present

## 2024-01-05 DIAGNOSIS — M545 Low back pain, unspecified: Secondary | ICD-10-CM | POA: Diagnosis not present

## 2024-01-05 DIAGNOSIS — M519 Unspecified thoracic, thoracolumbar and lumbosacral intervertebral disc disorder: Secondary | ICD-10-CM | POA: Diagnosis not present

## 2024-01-07 DIAGNOSIS — M5412 Radiculopathy, cervical region: Secondary | ICD-10-CM | POA: Diagnosis not present

## 2024-01-07 DIAGNOSIS — M5136 Other intervertebral disc degeneration, lumbar region with discogenic back pain only: Secondary | ICD-10-CM | POA: Diagnosis not present

## 2024-01-07 DIAGNOSIS — M5134 Other intervertebral disc degeneration, thoracic region: Secondary | ICD-10-CM | POA: Diagnosis not present

## 2024-01-07 DIAGNOSIS — M50322 Other cervical disc degeneration at C5-C6 level: Secondary | ICD-10-CM | POA: Diagnosis not present

## 2024-01-07 DIAGNOSIS — M9901 Segmental and somatic dysfunction of cervical region: Secondary | ICD-10-CM | POA: Diagnosis not present

## 2024-01-07 DIAGNOSIS — M9903 Segmental and somatic dysfunction of lumbar region: Secondary | ICD-10-CM | POA: Diagnosis not present

## 2024-01-07 DIAGNOSIS — M9902 Segmental and somatic dysfunction of thoracic region: Secondary | ICD-10-CM | POA: Diagnosis not present

## 2024-01-13 DIAGNOSIS — M9903 Segmental and somatic dysfunction of lumbar region: Secondary | ICD-10-CM | POA: Diagnosis not present

## 2024-01-13 DIAGNOSIS — M50322 Other cervical disc degeneration at C5-C6 level: Secondary | ICD-10-CM | POA: Diagnosis not present

## 2024-01-13 DIAGNOSIS — M9902 Segmental and somatic dysfunction of thoracic region: Secondary | ICD-10-CM | POA: Diagnosis not present

## 2024-01-13 DIAGNOSIS — M5412 Radiculopathy, cervical region: Secondary | ICD-10-CM | POA: Diagnosis not present

## 2024-01-13 DIAGNOSIS — M9901 Segmental and somatic dysfunction of cervical region: Secondary | ICD-10-CM | POA: Diagnosis not present

## 2024-01-13 DIAGNOSIS — M5134 Other intervertebral disc degeneration, thoracic region: Secondary | ICD-10-CM | POA: Diagnosis not present

## 2024-01-13 DIAGNOSIS — M5136 Other intervertebral disc degeneration, lumbar region with discogenic back pain only: Secondary | ICD-10-CM | POA: Diagnosis not present

## 2024-01-17 DIAGNOSIS — G4730 Sleep apnea, unspecified: Secondary | ICD-10-CM | POA: Diagnosis not present

## 2024-01-17 DIAGNOSIS — R55 Syncope and collapse: Secondary | ICD-10-CM | POA: Diagnosis not present

## 2024-01-17 DIAGNOSIS — Z6830 Body mass index (BMI) 30.0-30.9, adult: Secondary | ICD-10-CM | POA: Diagnosis not present

## 2024-01-17 DIAGNOSIS — I4510 Unspecified right bundle-branch block: Secondary | ICD-10-CM | POA: Diagnosis not present

## 2024-01-17 DIAGNOSIS — E119 Type 2 diabetes mellitus without complications: Secondary | ICD-10-CM | POA: Diagnosis not present

## 2024-01-17 DIAGNOSIS — I10 Essential (primary) hypertension: Secondary | ICD-10-CM | POA: Diagnosis not present

## 2024-01-17 DIAGNOSIS — R0789 Other chest pain: Secondary | ICD-10-CM | POA: Diagnosis not present

## 2024-01-17 DIAGNOSIS — I2510 Atherosclerotic heart disease of native coronary artery without angina pectoris: Secondary | ICD-10-CM | POA: Diagnosis not present

## 2024-01-17 DIAGNOSIS — R002 Palpitations: Secondary | ICD-10-CM | POA: Diagnosis not present

## 2024-01-17 DIAGNOSIS — R0609 Other forms of dyspnea: Secondary | ICD-10-CM | POA: Diagnosis not present

## 2024-01-18 DIAGNOSIS — M9902 Segmental and somatic dysfunction of thoracic region: Secondary | ICD-10-CM | POA: Diagnosis not present

## 2024-01-18 DIAGNOSIS — M5134 Other intervertebral disc degeneration, thoracic region: Secondary | ICD-10-CM | POA: Diagnosis not present

## 2024-01-18 DIAGNOSIS — M5412 Radiculopathy, cervical region: Secondary | ICD-10-CM | POA: Diagnosis not present

## 2024-01-18 DIAGNOSIS — M9901 Segmental and somatic dysfunction of cervical region: Secondary | ICD-10-CM | POA: Diagnosis not present

## 2024-01-18 DIAGNOSIS — M50322 Other cervical disc degeneration at C5-C6 level: Secondary | ICD-10-CM | POA: Diagnosis not present

## 2024-01-18 DIAGNOSIS — M5136 Other intervertebral disc degeneration, lumbar region with discogenic back pain only: Secondary | ICD-10-CM | POA: Diagnosis not present

## 2024-01-18 DIAGNOSIS — M9903 Segmental and somatic dysfunction of lumbar region: Secondary | ICD-10-CM | POA: Diagnosis not present

## 2024-01-20 DIAGNOSIS — M5134 Other intervertebral disc degeneration, thoracic region: Secondary | ICD-10-CM | POA: Diagnosis not present

## 2024-01-20 DIAGNOSIS — M9903 Segmental and somatic dysfunction of lumbar region: Secondary | ICD-10-CM | POA: Diagnosis not present

## 2024-01-20 DIAGNOSIS — M9901 Segmental and somatic dysfunction of cervical region: Secondary | ICD-10-CM | POA: Diagnosis not present

## 2024-01-20 DIAGNOSIS — M50322 Other cervical disc degeneration at C5-C6 level: Secondary | ICD-10-CM | POA: Diagnosis not present

## 2024-01-20 DIAGNOSIS — M9902 Segmental and somatic dysfunction of thoracic region: Secondary | ICD-10-CM | POA: Diagnosis not present

## 2024-01-20 DIAGNOSIS — M5136 Other intervertebral disc degeneration, lumbar region with discogenic back pain only: Secondary | ICD-10-CM | POA: Diagnosis not present

## 2024-01-20 DIAGNOSIS — M5412 Radiculopathy, cervical region: Secondary | ICD-10-CM | POA: Diagnosis not present

## 2024-01-25 DIAGNOSIS — M9903 Segmental and somatic dysfunction of lumbar region: Secondary | ICD-10-CM | POA: Diagnosis not present

## 2024-01-25 DIAGNOSIS — M9901 Segmental and somatic dysfunction of cervical region: Secondary | ICD-10-CM | POA: Diagnosis not present

## 2024-01-25 DIAGNOSIS — M50322 Other cervical disc degeneration at C5-C6 level: Secondary | ICD-10-CM | POA: Diagnosis not present

## 2024-01-25 DIAGNOSIS — M5412 Radiculopathy, cervical region: Secondary | ICD-10-CM | POA: Diagnosis not present

## 2024-01-25 DIAGNOSIS — M5134 Other intervertebral disc degeneration, thoracic region: Secondary | ICD-10-CM | POA: Diagnosis not present

## 2024-01-25 DIAGNOSIS — M5136 Other intervertebral disc degeneration, lumbar region with discogenic back pain only: Secondary | ICD-10-CM | POA: Diagnosis not present

## 2024-01-25 DIAGNOSIS — M9902 Segmental and somatic dysfunction of thoracic region: Secondary | ICD-10-CM | POA: Diagnosis not present

## 2024-02-01 DIAGNOSIS — M50322 Other cervical disc degeneration at C5-C6 level: Secondary | ICD-10-CM | POA: Diagnosis not present

## 2024-02-01 DIAGNOSIS — M5136 Other intervertebral disc degeneration, lumbar region with discogenic back pain only: Secondary | ICD-10-CM | POA: Diagnosis not present

## 2024-02-01 DIAGNOSIS — M9901 Segmental and somatic dysfunction of cervical region: Secondary | ICD-10-CM | POA: Diagnosis not present

## 2024-02-01 DIAGNOSIS — M5134 Other intervertebral disc degeneration, thoracic region: Secondary | ICD-10-CM | POA: Diagnosis not present

## 2024-02-01 DIAGNOSIS — M9903 Segmental and somatic dysfunction of lumbar region: Secondary | ICD-10-CM | POA: Diagnosis not present

## 2024-02-01 DIAGNOSIS — M5412 Radiculopathy, cervical region: Secondary | ICD-10-CM | POA: Diagnosis not present

## 2024-02-01 DIAGNOSIS — M9902 Segmental and somatic dysfunction of thoracic region: Secondary | ICD-10-CM | POA: Diagnosis not present

## 2024-02-15 DIAGNOSIS — E1140 Type 2 diabetes mellitus with diabetic neuropathy, unspecified: Secondary | ICD-10-CM | POA: Diagnosis not present

## 2024-02-15 DIAGNOSIS — E1151 Type 2 diabetes mellitus with diabetic peripheral angiopathy without gangrene: Secondary | ICD-10-CM | POA: Diagnosis not present

## 2024-02-28 DIAGNOSIS — L821 Other seborrheic keratosis: Secondary | ICD-10-CM | POA: Diagnosis not present

## 2024-02-28 DIAGNOSIS — L219 Seborrheic dermatitis, unspecified: Secondary | ICD-10-CM | POA: Diagnosis not present

## 2024-02-28 DIAGNOSIS — L209 Atopic dermatitis, unspecified: Secondary | ICD-10-CM | POA: Diagnosis not present

## 2024-02-28 DIAGNOSIS — Z85828 Personal history of other malignant neoplasm of skin: Secondary | ICD-10-CM | POA: Diagnosis not present

## 2024-02-29 DIAGNOSIS — M5412 Radiculopathy, cervical region: Secondary | ICD-10-CM | POA: Diagnosis not present

## 2024-02-29 DIAGNOSIS — M9901 Segmental and somatic dysfunction of cervical region: Secondary | ICD-10-CM | POA: Diagnosis not present

## 2024-02-29 DIAGNOSIS — M5134 Other intervertebral disc degeneration, thoracic region: Secondary | ICD-10-CM | POA: Diagnosis not present

## 2024-02-29 DIAGNOSIS — M9903 Segmental and somatic dysfunction of lumbar region: Secondary | ICD-10-CM | POA: Diagnosis not present

## 2024-02-29 DIAGNOSIS — M9902 Segmental and somatic dysfunction of thoracic region: Secondary | ICD-10-CM | POA: Diagnosis not present

## 2024-02-29 DIAGNOSIS — M5136 Other intervertebral disc degeneration, lumbar region with discogenic back pain only: Secondary | ICD-10-CM | POA: Diagnosis not present

## 2024-02-29 DIAGNOSIS — M50322 Other cervical disc degeneration at C5-C6 level: Secondary | ICD-10-CM | POA: Diagnosis not present

## 2024-03-09 DIAGNOSIS — M25775 Osteophyte, left foot: Secondary | ICD-10-CM | POA: Diagnosis not present

## 2024-03-09 DIAGNOSIS — M79675 Pain in left toe(s): Secondary | ICD-10-CM | POA: Diagnosis not present

## 2024-03-09 DIAGNOSIS — M79672 Pain in left foot: Secondary | ICD-10-CM | POA: Diagnosis not present

## 2024-03-09 DIAGNOSIS — L110 Acquired keratosis follicularis: Secondary | ICD-10-CM | POA: Diagnosis not present

## 2024-03-15 DIAGNOSIS — I10 Essential (primary) hypertension: Secondary | ICD-10-CM | POA: Diagnosis not present

## 2024-03-15 DIAGNOSIS — E119 Type 2 diabetes mellitus without complications: Secondary | ICD-10-CM | POA: Diagnosis not present

## 2024-03-15 DIAGNOSIS — R202 Paresthesia of skin: Secondary | ICD-10-CM | POA: Diagnosis not present

## 2024-03-15 DIAGNOSIS — M549 Dorsalgia, unspecified: Secondary | ICD-10-CM | POA: Diagnosis not present

## 2024-03-15 DIAGNOSIS — Z299 Encounter for prophylactic measures, unspecified: Secondary | ICD-10-CM | POA: Diagnosis not present

## 2024-03-28 DIAGNOSIS — M9902 Segmental and somatic dysfunction of thoracic region: Secondary | ICD-10-CM | POA: Diagnosis not present

## 2024-03-28 DIAGNOSIS — M50322 Other cervical disc degeneration at C5-C6 level: Secondary | ICD-10-CM | POA: Diagnosis not present

## 2024-03-28 DIAGNOSIS — M5136 Other intervertebral disc degeneration, lumbar region with discogenic back pain only: Secondary | ICD-10-CM | POA: Diagnosis not present

## 2024-03-28 DIAGNOSIS — M9901 Segmental and somatic dysfunction of cervical region: Secondary | ICD-10-CM | POA: Diagnosis not present

## 2024-03-28 DIAGNOSIS — M5412 Radiculopathy, cervical region: Secondary | ICD-10-CM | POA: Diagnosis not present

## 2024-03-28 DIAGNOSIS — M5134 Other intervertebral disc degeneration, thoracic region: Secondary | ICD-10-CM | POA: Diagnosis not present

## 2024-03-28 DIAGNOSIS — M9903 Segmental and somatic dysfunction of lumbar region: Secondary | ICD-10-CM | POA: Diagnosis not present
